# Patient Record
Sex: Female | Born: 1995
Health system: Southern US, Community
[De-identification: ages and names within clinical notes are randomized; demographics above are authoritative.]

## PROBLEM LIST (undated history)

## (undated) DIAGNOSIS — F329 Major depressive disorder, single episode, unspecified: Secondary | ICD-10-CM

## (undated) DIAGNOSIS — O4292 Full-term premature rupture of membranes, unspecified as to length of time between rupture and onset of labor: Secondary | ICD-10-CM

## (undated) DIAGNOSIS — F32A Depression, unspecified: Secondary | ICD-10-CM

## (undated) HISTORY — DX: Full-term premature rupture of membranes, unspecified as to length of time between rupture and onset of labor: O42.92

## (undated) HISTORY — DX: Depression, unspecified: F32.A

---
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
## 2015-07-21 DIAGNOSIS — F909 Attention-deficit hyperactivity disorder, unspecified type: Secondary | ICD-10-CM | POA: Insufficient documentation

## 2015-10-01 DIAGNOSIS — Z0000 Encounter for general adult medical examination without abnormal findings: Secondary | ICD-10-CM | POA: Insufficient documentation

## 2015-10-01 DIAGNOSIS — Z309 Encounter for contraceptive management, unspecified: Secondary | ICD-10-CM | POA: Insufficient documentation

## 2016-05-23 MED FILL — NORG-EE 0.18-0.215-0.25/0.0: 0.18/0.215/ | 84 days supply | Qty: 84 | Fill #0

## 2016-06-07 ENCOUNTER — Ambulatory Visit (INDEPENDENT_AMBULATORY_CARE_PROVIDER_SITE_OTHER): Payer: 59 | Admitting: Sports Medicine

## 2016-06-07 ENCOUNTER — Encounter: Payer: Self-pay | Admitting: Sports Medicine

## 2016-06-07 DIAGNOSIS — Q667 Congenital pes cavus, unspecified foot: Secondary | ICD-10-CM | POA: Insufficient documentation

## 2016-06-07 NOTE — Progress Notes (Signed)
   Subjective:    I'm seeing this patient as a consultation for:  Dr. Sunnie NielsenNatalie Duncan  CC: Right knee pain, pes cavus  HPI:  This is a pleasant 21 year old female, she has a history of what sounds to be hip dysplasia, over the years she has done well but unfortunately has had some pain that she localizes on the anterior right knee, worse with standing for long periods of time, moderate, persistent without radiation or mechanical symptoms.  Past medical history:  Negative.  See flowsheet/record as well for more information.  Surgical history: Negative.  See flowsheet/record as well for more information.  Family history: Negative.  See flowsheet/record as well for more information.  Social history: Negative.  See flowsheet/record as well for more information.  Allergies, and medications have been entered into the medical record, reviewed, and no changes needed.   Review of Systems: No headache, visual changes, nausea, vomiting, diarrhea, constipation, dizziness, abdominal pain, skin rash, fevers, chills, night sweats, weight loss, swollen lymph nodes, body aches, joint swelling, muscle aches, chest pain, shortness of breath, mood changes, visual or auditory hallucinations.   Objective:   General: Well Developed, well nourished, and in no acute distress.  Neuro/Psych: Alert and oriented x3, extra-ocular muscles intact, able to move all 4 extremities, sensation grossly intact. Skin: Warm and dry, no rashes noted.  Respiratory: Not using accessory muscles, speaking in full sentences, trachea midline.  Cardiovascular: Pulses palpable, no extremity edema. Abdomen: Does not appear distended. Right Knee: Normal to inspection with no erythema or effusion or obvious bony abnormalities. Palpation normal with no warmth or joint line tenderness or patellar tenderness or condyle tenderness. ROM normal in flexion and extension and lower leg rotation. Ligaments with solid consistent endpoints  including ACL, PCL, LCL, MCL. Negative Mcmurray's and provocative meniscal tests. Non painful patellar compression. Patellar and quadriceps tendons unremarkable. Hamstring and quadriceps strength is normal. Bilateral pes cavus   Patient was fitted for a : standard, cushioned, semi-rigid orthotic. The orthotic was heated and afterward the patient stood on the orthotic blank positioned on the orthotic stand. The patient was positioned in subtalar neutral position and 10 degrees of ankle dorsiflexion in a weight bearing stance. After completion of molding, a stable base was applied to the orthotic blank. The blank was ground to a stable position for weight bearing. Size: 7 Base: [facility] Additional Posting and Padding: None The patient ambulated these, and they were very comfortable.  Impression and Recommendations:   This case required medical decision making of moderate complexity.  Pes cavus Pes cavus with some right-sided knee pain consistent with dynamic overpronation. Custom orthotics as above, patellofemoral rehabilitation exercises given. Return to see me on an as-needed basis.

## 2016-06-07 NOTE — Assessment & Plan Note (Signed)
Pes cavus with some right-sided knee pain consistent with dynamic overpronation. Custom orthotics as above, patellofemoral rehabilitation exercises given. Return to see me on an as-needed basis.

## 2016-06-13 ENCOUNTER — Ambulatory Visit: Payer: 59 | Admitting: Osteopathic Medicine

## 2016-06-15 ENCOUNTER — Encounter: Payer: Self-pay | Admitting: Osteopathic Medicine

## 2016-06-15 ENCOUNTER — Ambulatory Visit (INDEPENDENT_AMBULATORY_CARE_PROVIDER_SITE_OTHER): Payer: 59 | Admitting: Osteopathic Medicine

## 2016-06-15 VITALS — BP 131/78 | HR 100 | Ht 65.0 in | Wt 139.0 lb

## 2016-06-15 DIAGNOSIS — F909 Attention-deficit hyperactivity disorder, unspecified type: Secondary | ICD-10-CM | POA: Diagnosis not present

## 2016-06-15 DIAGNOSIS — Z3041 Encounter for surveillance of contraceptive pills: Secondary | ICD-10-CM

## 2016-06-15 MED ORDER — METHYLPHENIDATE HCL ER 10 MG PO TBCR: 10.0000 mg | EXTENDED_RELEASE_TABLET | Freq: Every day | ORAL | 0 refills | Status: DC

## 2016-06-15 MED ORDER — NORGESTIM-ETH ESTRAD TRIPHASIC 0.18/0.215/0.25 MG-35 MCG PO TABS: 1.0000 | ORAL_TABLET | Freq: Every day | ORAL | 3 refills | Status: DC

## 2016-06-15 NOTE — Progress Notes (Signed)
HPI: Hannah Duncan is a 21 y.o. female  who presents to [facility] today, 06/15/16,  for chief complaint of:  Chief Complaint  Patient presents with  . Establish Care    MEDICATION REFILL    Attention deficit disorder: Doing well on Concerta, would like to continue this medication. [place] controlled substance database reviewed and records reviewed, consistent with patient's history.  Discussion of preventive care: Patient is 21 years old, due to start getting Pap smears. Patient requests refill on birth control, she states at some point she would like to try coming off of hormones, is considering copper IUD.  Past medical history, surgical history, social history and family history reviewed.  Patient Active Problem List   Diagnosis Date Noted  . Pes cavus 06/07/2016    Current medication list and allergy/intolerance information reviewed.   No current outpatient prescriptions on file prior to visit.   No current facility-administered medications on file prior to visit.    No Known Allergies    Review of Systems:  Constitutional: No recent illness  HEENT: No  headache, no vision change  Cardiac: No  chest pain, No  pressure, No palpitations  Respiratory:  No  shortness of breath. No  Cough  Gastrointestinal: No  abdominal pain, no change on bowel habits  Musculoskeletal: No new myalgia/arthralgia  Skin: No  Rash  Hem/Onc: No  easy bruising/bleeding, No  abnormal lumps/bumps  Neurologic: No  weakness, No  Dizziness  Psychiatric: No  concerns with depression, No  concerns with anxiety  Exam:  BP 131/78   Pulse 100   Ht  (1.651 m)   Wt 139 lb (63 kg)   BMI 23.13 kg/m   Constitutional: VS see above. General Appearance: alert, well-developed, well-nourished, NAD  Eyes: Normal lids and conjunctive, non-icteric sclera  Ears, Nose, Mouth, Throat: MMM, Normal external inspection ears/nares/mouth/lips/gums.  Neck: No  masses, trachea midline.   Respiratory: Normal respiratory effort. no wheeze, no rhonchi, no rales  Cardiovascular: S1/S2 normal, no murmur, no rub/gallop auscultated. RRR.   Musculoskeletal: Gait normal. Symmetric and independent movement of all extremities  Neurological: Normal balance/coordination. No tremor.  Skin: warm, dry, intact.   Psychiatric: Normal judgment/insight. Normal mood and affect. Oriented x3.      Depression screen PHQ 2/9 06/15/2016  Decreased Interest 0  Down, Depressed, Hopeless 0  PHQ - 2 Score 0      ASSESSMENT/PLAN:   3 prescriptions for ADHD medication were completed and provided to the patient. Controlled substance contract reviewed by medical assistant and on file.  Attention deficit hyperactivity disorder (ADHD), unspecified ADHD type - Controlled substance agreement signed 06/15/2016.  Encounter for surveillance of contraceptive pills    Follow-up plan: Return in about 3 months (around 09/14/2016) for ADHD MEDICATION REFILL, sooner if needed.  Visit summary with medication list and pertinent instructions was printed for patient to review, alert [place] if any changes needed. All questions at time of visit were answered - patient instructed to contact office with any additional concerns. ER/RTC precautions were reviewed with the patient and understanding verbalized.

## 2016-06-21 ENCOUNTER — Telehealth: Payer: Self-pay | Admitting: Emergency Medicine

## 2016-06-21 NOTE — Telephone Encounter (Signed)
Patient calling to say her pharmacy needs prior auth for her methylphenidate 10 MG ER tablet.

## 2016-06-21 NOTE — Telephone Encounter (Signed)
Help.

## 2016-06-23 ENCOUNTER — Ambulatory Visit: Payer: 59

## 2016-06-23 NOTE — Telephone Encounter (Signed)
Sent prior authorization through cover my meds waiting on determination from insurance company. - CF

## 2016-06-27 ENCOUNTER — Ambulatory Visit: Payer: 59

## 2016-07-01 NOTE — Telephone Encounter (Signed)
Methylphenidate has been approved through the insurance. Left message on patient's vm.pharmacy aware

## 2016-07-04 ENCOUNTER — Ambulatory Visit: Payer: 59 | Admitting: Sports Medicine

## 2016-07-14 ENCOUNTER — Ambulatory Visit: Payer: 59 | Admitting: Sports Medicine

## 2016-07-26 MED FILL — METHYLPHENIDATE ER 10 MG TA: 10 | 30 days supply | Qty: 30 | Fill #0

## 2016-08-23 MED FILL — METHYLPHENIDATE ER 10 MG TA: 10 | 30 days supply | Qty: 30 | Fill #0

## 2016-09-15 ENCOUNTER — Encounter: Payer: Self-pay | Admitting: Osteopathic Medicine

## 2016-09-15 ENCOUNTER — Ambulatory Visit (INDEPENDENT_AMBULATORY_CARE_PROVIDER_SITE_OTHER): Payer: 59 | Admitting: Osteopathic Medicine

## 2016-09-15 VITALS — BP 112/55 | HR 89 | Ht 65.0 in | Wt 138.1 lb

## 2016-09-15 DIAGNOSIS — F909 Attention-deficit hyperactivity disorder, unspecified type: Secondary | ICD-10-CM

## 2016-09-15 MED ORDER — METHYLPHENIDATE HCL 10 MG PO TABS: 10.0000 mg | ORAL_TABLET | Freq: Two times a day (BID) | ORAL | 0 refills | Status: DC

## 2016-09-15 MED ORDER — METHYLPHENIDATE HCL 10 MG PO TABS
10.0000 mg | ORAL_TABLET | Freq: Two times a day (BID) | ORAL | 0 refills | Status: DC
Start: 2016-09-15 — End: 2016-12-26

## 2016-09-15 NOTE — Progress Notes (Signed)
HPI: Hannah Duncan is a 21 y.o. female  who [facility] today, 09/15/16,  for chief complaint of:  Chief Complaint  Patient presents with  . ADHD    Attention deficit disorder: Doing well on Concerta, would like to continue this medication however the extended release formulation is quite expensive so would like to switch to the IR version.     Past medical history, surgical history, social history and family history reviewed.  Patient Active Problem List   Diagnosis Date Noted  . Pes cavus 06/07/2016  . Encounter for contraceptive management 10/01/2015  . Attention deficit hyperactivity disorder (ADHD) 07/21/2015    Current medication list and allergy/intolerance information reviewed.   Current Outpatient Prescriptions on File Prior to Visit  Medication Sig Dispense Refill  . Ascorbic Acid (VITAMIN C) 100 MG tablet Take 100 mg by mouth daily.    . cholecalciferol (VITAMIN D) 1000 units tablet Take 1,000 Units by mouth daily.    . Cranberry 125 MG TABS Take by mouth.    . methylphenidate 10 MG ER tablet Take 1 tablet (10 mg total) by mouth daily. 30 tablet 0  . Norgestimate-Ethinyl Estradiol Triphasic 0.18/0.215/0.25 MG-35 MCG tablet Take 1 tablet by mouth daily. (Patient not taking: Reported on 09/15/2016) 3 Package 3   No current facility-administered medications on file prior to visit.    No Known Allergies    Review of Systems:  Constitutional: No recent illness, feeling well today   Cardiac: No  chest pain, No  pressure, No palpitations  Respiratory:  No  shortness of breath.  Exam:  BP (!) 112/55   Pulse 89   Ht 5\' 5"  (1.651 m)   Wt 138 lb 1.6 oz (62.6 kg)   LMP 09/09/2016   SpO2 100%   BMI 22.98 kg/m   Constitutional: VS see above. General Appearance: alert, well-developed, well-nourished, NAD  Neck: No masses, trachea midline.   Respiratory: Normal respiratory effort.   Musculoskeletal: Gait normal.    Neurological: Normal balance/coordination. No tremor.  Psychiatric: Normal judgment/insight. Normal mood and affect. Oriented x3.      Depression screen PHQ 2/9 06/15/2016  Decreased Interest 0  Down, Depressed, Hopeless 0  PHQ - 2 Score 0      ASSESSMENT/PLAN:   3 prescriptions for ADHD medication were completed and provided to the patient. Controlled substance contract on file.  Note: stopped OCP d/t acne, no need for contraception at this time, pt will call me if this is a need in the future   Attention deficit hyperactivity disorder (ADHD), unspecified ADHD type - Plan: methylphenidate (RITALIN) 10 MG tablet, methylphenidate (RITALIN) 10 MG tablet, methylphenidate (RITALIN) 10 MG tablet    Follow-up plan: Return in about 3 months (around 12/16/2016) for recheck ADHD, sooner if needed.  Visit summary with medication list and pertinent instructions was printed for patient to review, alert us if any changes needed. All questions at time of visit were answered - patient instructed to contact office with any additional concerns. ER/RTC precautions were reviewed with the patient and understanding verbalized.

## 2016-09-21 ENCOUNTER — Encounter: Payer: Self-pay | Admitting: Osteopathic Medicine

## 2016-09-21 ENCOUNTER — Ambulatory Visit (INDEPENDENT_AMBULATORY_CARE_PROVIDER_SITE_OTHER): Payer: 59 | Admitting: Osteopathic Medicine

## 2016-09-21 ENCOUNTER — Other Ambulatory Visit (HOSPITAL_COMMUNITY)
Admission: RE | Admit: 2016-09-21 | Discharge: 2016-09-21 | Disposition: A | Discharge status: Home / Self Care | Payer: 59 | Source: Ambulatory Visit | Attending: Family Medicine | Admitting: Family Medicine

## 2016-09-21 VITALS — BP 128/63 | HR 94

## 2016-09-21 DIAGNOSIS — Z0000 Encounter for general adult medical examination without abnormal findings: Secondary | ICD-10-CM | POA: Diagnosis not present

## 2016-09-21 DIAGNOSIS — Z01419 Encounter for gynecological examination (general) (routine) without abnormal findings: Secondary | ICD-10-CM | POA: Diagnosis not present

## 2016-09-21 NOTE — Patient Instructions (Signed)
Plan:  Pap results will be back within a week, please call us if you don't hear back about results!  See below for viral cold treatments.  As long as everything is doing well, we will see you as usual for your ADHD followup, sooner if needed.    Over-the-Counter Medications & Home Remedies for Upper Respiratory Illness  Note: the following list assumes no pregnancy, normal liver & kidney function and no other drug interactions. Dr. Lyn HollingsheadAlexander has highlighted medications which are safe for you to use, but these may not be appropriate for everyone. Always ask a pharmacist or qualified medical provider if you have any questions!   Aches/Pains, Fever, Headache Acetaminophen (Tylenol) 500 mg tablets - take max 2 tablets (1000 mg) every 6 hours (4 times per day)  Ibuprofen (Motrin) 200 mg tablets - take max 4 tablets (800 mg) every 6 hours*  Sinus Congestion Nasal Saline if desired Oxymetolazone (Afrin, others) sparing use due to rebound congestion, NEVER use in kids Phenylephrine (Sudafed) 10 mg tablets every 4 hours (or the 12-hour formulation)* Diphenhydramine (Benadryl) 25 mg tablets - take max 2 tablets every 4 hours  Cough & Sore Throat Prescription cough pills or syrups as directed Dextromethorphan (Robitussin, others) - cough suppressant Guaifenesin (Robitussin, Mucinex, others) - expectorant (helps cough up mucus) (Dextromethorphan and Guaifenesin also come in a combination tablet) Lozenges w/ Benzocaine + Menthol (Cepacol) Honey - as much as you want! Teas which "coat the throat" - look for ingredients Elm Bark, Licorice Root, Marshmallow Root  Other Zinc Lozenges within 24 hours of symptoms onset - mixed evidence this shortens the duration of the common cold Don't waste your money on Vitamin C or Echinacea  *Caution in patients with high blood pressure

## 2016-09-21 NOTE — Progress Notes (Signed)
HPI: Hannah Duncan is a 21 y.o. female  who presents to [facility] today, 09/21/16,  for chief complaint of:  Chief Complaint  Patient presents with  . Annual Exam      Patient here for annual physical / wellness exam.  See preventive care reviewed as below.  Recent labs reviewed in detail with the patient.   Additional concerns today include:  Feeling sick a few days, thinks just a cold, not really bothersome   Past medical, surgical, social and family history reviewed: Patient Active Problem List   Diagnosis Date Noted  . Pes cavus 06/07/2016  . Encounter for contraceptive management 10/01/2015  . Attention deficit hyperactivity disorder (ADHD) 07/21/2015   Past Surgical History:  Procedure Laterality Date  . WISDOM TOOTH EXTRACTION  2012   Social History  Substance Use Topics  . Smoking status: Never Smoker  . Smokeless tobacco: Never Used  . Alcohol use Not on file   Family History  Problem Relation Age of Onset  . Cancer Mother   . Hyperlipidemia Father   . Cancer Maternal Grandmother   . Cancer Maternal Grandfather      Current medication list and allergy/intolerance information reviewed:   Current Outpatient Prescriptions  Medication Sig Dispense Refill  . Ascorbic Acid (VITAMIN C) 100 MG tablet Take 100 mg by mouth daily.    . cholecalciferol (VITAMIN D) 1000 units tablet Take 1,000 Units by mouth daily.    . Cranberry 125 MG TABS Take by mouth.    . methylphenidate (RITALIN) 10 MG tablet Take 1 tablet (10 mg total) by mouth 2 (two) times daily with breakfast and lunch. 60 tablet 0  . methylphenidate (RITALIN) 10 MG tablet Take 1 tablet (10 mg total) by mouth 2 (two) times daily with breakfast and lunch. Fill no sooner than 30 days from date on Rx 60 tablet 0  . methylphenidate (RITALIN) 10 MG tablet Take 1 tablet (10 mg total) by mouth 2 (two) times daily with breakfast and lunch. Fill no sooner than 60 days from date on  Rx 60 tablet 0   No current facility-administered medications for this visit.    No Known Allergies    Review of Systems:  Constitutional:  No  fever, no chills, No unintentional weight changes. No significant fatigue.   HEENT: No  headache, no vision change, no hearing change, +sore throat, +sinus pressure  Cardiac: No  chest pain, No  pressure  Respiratory:  No  shortness of breath. No  Cough  Gastrointestinal: No  abdominal pain, No  nausea, No  vomiting,  Musculoskeletal: No new myalgia/arthralgia  Genitourinary: No  incontinence, No  abnormal genital bleeding, No abnormal genital discharge  Skin: No  Rash  Neurologic: No  weakness, No  dizziness,  Psychiatric: No  concerns with depression, No  concerns with anxiety  Exam:  BP 128/63   Pulse 94   LMP 09/09/2016   Constitutional: VS see above. General Appearance: alert, well-developed, well-nourished, NAD  Eyes: Normal lids and conjunctive, non-icteric sclera  Ears, Nose, Mouth, Throat: MMM, Normal external inspection ears/nares/mouth/lips/gums. TM normal bilaterally. Pharynx/tonsils no erythema, no exudate. Nasal mucosa normal.   Neck: No masses, trachea midline. No thyroid enlargement. No tenderness/mass appreciated. No lymphadenopathy  Respiratory: Normal respiratory effort. no wheeze, no rhonchi, no rales  Cardiovascular: S1/S2 normal, no murmur, no rub/gallop auscultated. RRR. No lower extremity edema.   Gastrointestinal: Nontender, no masses.   Musculoskeletal: Gait normal. No clubbing/cyanosis of digits.  Neurological: Normal balance/coordination. No tremor.   Skin: warm, dry, intact. No rash/ulcer. No concerning nevi or subq nodules on limited exam.    Psychiatric: Normal judgment/insight. Normal mood and affect. Oriented x3.  GYN: No lesions/ulcers to external genitalia, normal urethra, normal vaginal mucosa, physiologic discharge, cervix normal without lesions, uterus not enlarged or tender, adnexa  no masses and nontender  BREAST: No rashes/skin changes, normal fibrous breast tissue, no masses or tenderness, normal nipple without discharge, normal axilla    ASSESSMENT/PLAN:   Annual physical exam - Plan: COMPLETE METABOLIC PANEL WITH GFR, Lipid panel, Cytology - PAP   FEMALE PREVENTIVE CARE Updated 09/21/16   ANNUAL SCREENING/COUNSELING  Diet/Exercise - HEALTHY HABITS DISCUSSED TO DECREASE CV RISK History  Smoking Status  . Never Smoker  Smokeless Tobacco  . Never Used   History  Alcohol use Not on file  occasional   [facility] 06/15/2016  Decreased Interest 0  Down, Depressed, Hopeless 0  PHQ - 2 Score 0    Domestic violence concerns - no  HTN SCREENING - SEE VITALS  SEXUAL HEALTH  Sexually active in the past year - Yes with female. One partner   Need/want STI testing today? - no - declines  Concerns about libido or pain with sex? - no  Plans for pregnancy? - none at the moment   INFECTIOUS DISEASE SCREENING  HIV - declined  GC/CT - declined  HepC - DOB [birth year] - does not need  TB - does not need  DISEASE SCREENING  Lipid - does not need  DM2 - does not need  Osteoporosis - women age 99+ - does not need  CANCER SCREENING  Cervical - needs  Breast - does not need  Lung - does not need  Colon - does not need  ADULT VACCINATION  Influenza - annual vaccine recommended  Td - booster every 10 years   Zoster - option at 3550, yes at 60+   PCV13 - was not indicated  PPSV23 - was not indicated Immunization History  Administered Date(s) Administered  . HPV Quadrivalent 03/24/2008, 06/03/2009, 09/21/2009  . Influenza, Seasonal, Injecte, Preservative Fre 11/27/2015  . PPD Test 07/10/2015, 07/21/2015  . Tdap 03/15/2007    Patient Instructions  Plan:  Pap results will be back within a week, please call us if you don't hear back about results!  See below for viral cold treatments.  As long as everything is doing well,  we will see you as usual for your ADHD followup, sooner if needed.    Over-the-Counter Medications & Home Remedies for Upper Respiratory Illness  Note: the following list assumes no pregnancy, normal liver & kidney function and no other drug interactions. Dr. Lyn HollingsheadAlexander has highlighted medications which are safe for you to use, but these may not be appropriate for everyone. Always ask a pharmacist or qualified medical provider if you have any questions!   Aches/Pains, Fever, Headache Acetaminophen (Tylenol) 500 mg tablets - take max 2 tablets (1000 mg) every 6 hours (4 times per day)  Ibuprofen (Motrin) 200 mg tablets - take max 4 tablets (800 mg) every 6 hours*  Sinus Congestion Nasal Saline if desired Oxymetolazone (Afrin, others) sparing use due to rebound congestion, NEVER use in kids Phenylephrine (Sudafed) 10 mg tablets every 4 hours (or the 12-hour formulation)* Diphenhydramine (Benadryl) 25 mg tablets - take max 2 tablets every 4 hours  Cough & Sore Throat Prescription cough pills or syrups as directed Dextromethorphan (Robitussin, others) - cough suppressant  Guaifenesin (Robitussin, Mucinex, others) - expectorant (helps cough up mucus) (Dextromethorphan and Guaifenesin also come in a combination tablet) Lozenges w/ Benzocaine + Menthol (Cepacol) Honey - as much as you want! Teas which "coat the throat" - look for ingredients Elm Bark, Licorice Root, Marshmallow Root  Other Zinc Lozenges within 24 hours of symptoms onset - mixed evidence this shortens the duration of the common cold Don't waste your money on Vitamin C or Echinacea  *Caution in patients with high blood pressure       Visit summary with medication list and pertinent instructions was printed for patient to review. All questions at time of visit were answered - patient instructed to contact office with any additional concerns. ER/RTC precautions were reviewed with the patient. Follow-up plan: Return in about 1  year (around 09/21/2017) for [facility] PHYSICAL, sooner if needed and as directed for ADHD followup.

## 2016-09-22 LAB — LIPID PANEL
CHOL/HDL RATIO: 2.6 ratio (ref <5.0)
CHOLESTEROL: 194 mg/dL (ref <200)
HDL: 75 mg/dL (ref >50)
LDL CALC: 106 mg/dL — AB (ref <100)
TRIGLYCERIDES: 63 mg/dL (ref <150)
VLDL: 13 mg/dL (ref <30)

## 2016-09-22 LAB — COMPLETE METABOLIC PANEL WITH GFR
ALT: 27 U/L (ref 6–29)
AST: 29 U/L (ref 10–30)
Albumin: 4.6 g/dL (ref 3.6–5.1)
Alkaline Phosphatase: 77 U/L (ref 33–115)
BUN: 9 mg/dL (ref 7–25)
CHLORIDE: 100 mmol/L (ref 98–110)
CO2: 24 mmol/L (ref 20–31)
Calcium: 9.8 mg/dL (ref 8.6–10.2)
Creat: 0.6 mg/dL (ref 0.50–1.10)
GFR, Est African American: >89 mL/min (ref >=60)
GFR, Est Non African American: >89 mL/min (ref >=60)
Glucose, Bld: 82 mg/dL (ref 65–99)
POTASSIUM: 3.9 mmol/L (ref 3.5–5.3)
Sodium: 137 mmol/L (ref 135–146)
Total Bilirubin: 0.6 mg/dL (ref 0.2–1.2)
Total Protein: 7.3 g/dL (ref 6.1–8.1)

## 2016-09-26 LAB — CYTOLOGY - PAP: Diagnosis: NEGATIVE

## 2016-10-04 MED FILL — METHYLPHENIDATE 10 MG TAB: 10 | 30 days supply | Qty: 60 | Fill #0

## 2016-10-07 ENCOUNTER — Ambulatory Visit (INDEPENDENT_AMBULATORY_CARE_PROVIDER_SITE_OTHER): Payer: 59

## 2016-10-07 ENCOUNTER — Ambulatory Visit: Payer: 59 | Admitting: Osteopathic Medicine

## 2016-10-07 ENCOUNTER — Encounter (INDEPENDENT_AMBULATORY_CARE_PROVIDER_SITE_OTHER): Payer: Self-pay | Admitting: Orthopaedic Surgery

## 2016-10-07 ENCOUNTER — Ambulatory Visit (INDEPENDENT_AMBULATORY_CARE_PROVIDER_SITE_OTHER): Payer: 59 | Admitting: Orthopaedic Surgery

## 2016-10-07 DIAGNOSIS — M25562 Pain in left knee: Secondary | ICD-10-CM
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
## 2016-11-03 MED FILL — METHYLPHENIDATE 10 MG TAB: 10 | 30 days supply | Qty: 60 | Fill #0

## 2016-11-10 DIAGNOSIS — F321 Major depressive disorder, single episode, moderate: Secondary | ICD-10-CM | POA: Diagnosis not present

## 2016-11-10 DIAGNOSIS — F909 Attention-deficit hyperactivity disorder, unspecified type: Secondary | ICD-10-CM | POA: Diagnosis not present

## 2016-11-21 DIAGNOSIS — F909 Attention-deficit hyperactivity disorder, unspecified type: Secondary | ICD-10-CM | POA: Diagnosis not present

## 2016-11-21 DIAGNOSIS — F321 Major depressive disorder, single episode, moderate: Secondary | ICD-10-CM | POA: Diagnosis not present

## 2016-12-06 MED FILL — METHYLPHENIDATE 10 MG TAB: 10 | 30 days supply | Qty: 60 | Fill #0

## 2016-12-07 DIAGNOSIS — F909 Attention-deficit hyperactivity disorder, unspecified type: Secondary | ICD-10-CM | POA: Diagnosis not present

## 2016-12-07 DIAGNOSIS — F321 Major depressive disorder, single episode, moderate: Secondary | ICD-10-CM | POA: Diagnosis not present

## 2016-12-12 ENCOUNTER — Ambulatory Visit: Payer: 59

## 2016-12-14 DIAGNOSIS — F321 Major depressive disorder, single episode, moderate: Secondary | ICD-10-CM | POA: Diagnosis not present

## 2016-12-14 DIAGNOSIS — F909 Attention-deficit hyperactivity disorder, unspecified type: Secondary | ICD-10-CM | POA: Diagnosis not present

## 2016-12-19 ENCOUNTER — Ambulatory Visit: Payer: 59 | Admitting: Osteopathic Medicine

## 2016-12-26 ENCOUNTER — Encounter: Payer: Self-pay | Admitting: Osteopathic Medicine

## 2016-12-26 ENCOUNTER — Ambulatory Visit (INDEPENDENT_AMBULATORY_CARE_PROVIDER_SITE_OTHER): Payer: 59 | Admitting: Osteopathic Medicine

## 2016-12-26 VITALS — BP 119/70 | HR 78 | Ht 65.0 in | Wt 143.0 lb

## 2016-12-26 DIAGNOSIS — F331 Major depressive disorder, recurrent, moderate: Secondary | ICD-10-CM | POA: Diagnosis not present

## 2016-12-26 DIAGNOSIS — F321 Major depressive disorder, single episode, moderate: Secondary | ICD-10-CM | POA: Diagnosis not present

## 2016-12-26 DIAGNOSIS — F909 Attention-deficit hyperactivity disorder, unspecified type: Secondary | ICD-10-CM | POA: Diagnosis not present

## 2016-12-26 MED ORDER — METHYLPHENIDATE HCL 20 MG PO TABS: 20.0000 mg | ORAL_TABLET | Freq: Two times a day (BID) | ORAL | 0 refills | Status: DC

## 2016-12-26 MED ORDER — SCOPOLAMINE 1 MG/3DAYS TD PT72: 1.0000 | MEDICATED_PATCH | TRANSDERMAL | 1 refills | Status: DC

## 2016-12-26 MED ORDER — BUPROPION HCL ER (XL) 150 MG PO TB24: 150.0000 mg | ORAL_TABLET | ORAL | 1 refills | Status: DC

## 2016-12-26 MED FILL — METHYLPHENIDATE 20 MG TAB: 20 | 30 days supply | Qty: 60 | Fill #0

## 2016-12-26 MED FILL — BUPROPION HCL XL 150 MG TAB: 150 | 30 days supply | Qty: 30 | Fill #0

## 2016-12-26 NOTE — Progress Notes (Signed)
HPI: Hannah Duncan is a 21 y.o. female  who presents to [facility] today, 12/26/16,  for chief complaint of:  Chief Complaint  Patient presents with  . Depression    ADHD: we switched to Ritalin bid last visit d/t cost of Concerta.   Depression: Following with counselor. There is concern for major depressive disorder. Patient states she has been dealing with depression on and off for several years, never been on medications but family members have done well with Prozac and with Wellbutrin. Patient would like to try Wellbutrin since a few of her aunts have done really well on this medicine.   Past medical history, surgical history, social history and family history reviewed.  Patient Active Problem List   Diagnosis Date Noted  . Pes cavus 06/07/2016  . Encounter for contraceptive management 10/01/2015  . Attention deficit hyperactivity disorder (ADHD) 07/21/2015    Current medication list and allergy/intolerance information reviewed.   Current Outpatient Prescriptions on File Prior to Visit  Medication Sig Dispense Refill  . Ascorbic Acid (VITAMIN C) 100 MG tablet Take 100 mg by mouth daily.    . cholecalciferol (VITAMIN D) 1000 units tablet Take 1,000 Units by mouth daily.    . Cranberry 125 MG TABS Take by mouth.    . methylphenidate (RITALIN) 10 MG tablet Take 1 tablet (10 mg total) by mouth 2 (two) times daily with breakfast and lunch. 60 tablet 0  . methylphenidate (RITALIN) 10 MG tablet Take 1 tablet (10 mg total) by mouth 2 (two) times daily with breakfast and lunch. Fill no sooner than 30 days from date on Rx 60 tablet 0   No current facility-administered medications on file prior to visit.    No Known Allergies    Review of Systems:  Constitutional: No recent illness  Cardiac: No  chest pain, No  pressure,   Respiratory:  No  shortness of breath.   Neurologic: No  weakness, No  Dizziness  Psychiatric: +concerns with depression,  No  concerns with anxiety  Exam:  BP 119/70   Pulse 78   Ht  (1.651 m)   Wt 143 lb (64.9 kg)   BMI 23.80 kg/m   Constitutional: VS see above. General Appearance: alert, well-developed, well-nourished, NAD  Eyes: Normal lids and conjunctive, non-icteric sclera  Ears, Nose, Mouth, Throat: MMM, Normal external inspection ears/nares/mouth/lips/gums.  Neck: No masses, trachea midline.   Respiratory: Normal respiratory effort. no wheeze, no rhonchi, no rales  Cardiovascular: S1/S2 normal, no murmur, no rub/gallop auscultated. RRR.   Musculoskeletal: Gait normal. Symmetric and independent movement of all extremities  Neurological: Normal balance/coordination. No tremor.  Skin: warm, dry, intact.   Psychiatric: Normal judgment/insight. Normal mood and affect. Oriented x3.      ASSESSMENT/PLAN:   Moderate episode of recurrent major depressive disorder (HCC) - Plan: buPROPion (WELLBUTRIN XL) 150 MG 24 hr tablet  Attention deficit hyperactivity disorder (ADHD), unspecified ADHD type - Plan: methylphenidate (RITALIN) 20 MG tablet, DISCONTINUED: methylphenidate (RITALIN) 20 MG tablet, DISCONTINUED: methylphenidate (RITALIN) 20 MG tablet    Patient Instructions  Plan:  Start Wellbutrin 150 mg   If doing well, can refill this  If struggling, can increase dose to 300 mg after 4-6 weeks  - let me know!     Follow-up plan: Return in about 3 months (around 03/28/2017) for refill ADHD medications, sooner if needed .  Visit summary with medication list and pertinent instructions was printed for patient to review, alert [place]  if any changes needed. All questions at time of visit were answered - patient instructed to contact office with any additional concerns. ER/RTC precautions were reviewed with the patient and understanding verbalized.

## 2016-12-26 NOTE — Patient Instructions (Signed)
Plan:  Start Wellbutrin 150 mg   If doing well, can refill this  If struggling, can increase dose to 300 mg after 4-6 weeks  - let me know!

## 2016-12-27 DIAGNOSIS — F331 Major depressive disorder, recurrent, moderate: Secondary | ICD-10-CM | POA: Insufficient documentation

## 2017-01-09 DIAGNOSIS — F321 Major depressive disorder, single episode, moderate: Secondary | ICD-10-CM | POA: Diagnosis not present

## 2017-01-09 DIAGNOSIS — F909 Attention-deficit hyperactivity disorder, unspecified type: Secondary | ICD-10-CM | POA: Diagnosis not present

## 2017-01-23 MED FILL — BUPROPION HCL XL 150 MG TAB: 150 | 30 days supply | Qty: 30 | Fill #1

## 2017-01-25 MED FILL — TRANSDERM-SCOP 1.5 MG/3 DAY: 1 | 12 days supply | Qty: 4 | Fill #0

## 2017-01-25 MED FILL — METHYLPHENIDATE 20 MG TAB: 20 | 30 days supply | Qty: 60 | Fill #0

## 2017-02-01 ENCOUNTER — Encounter: Payer: Self-pay | Admitting: Osteopathic Medicine

## 2017-02-01 ENCOUNTER — Other Ambulatory Visit: Payer: Self-pay | Admitting: Osteopathic Medicine

## 2017-02-01 DIAGNOSIS — F331 Major depressive disorder, recurrent, moderate: Secondary | ICD-10-CM

## 2017-02-01 MED ORDER — BUPROPION HCL ER (XL) 150 MG PO TB24: 150.0000 mg | ORAL_TABLET | ORAL | 1 refills | Status: DC

## 2017-02-01 NOTE — Progress Notes (Signed)
refill 

## 2017-02-23 MED FILL — buPROPion HCL ER (XL) 150 M: 150 | 90 days supply | Qty: 90 | Fill #0

## 2017-03-08 DIAGNOSIS — H52223 Regular astigmatism, bilateral: Secondary | ICD-10-CM | POA: Diagnosis not present

## 2017-03-08 DIAGNOSIS — H5213 Myopia, bilateral: Secondary | ICD-10-CM | POA: Diagnosis not present

## 2017-03-28 ENCOUNTER — Ambulatory Visit: Payer: 59 | Admitting: Osteopathic Medicine

## 2017-03-29 ENCOUNTER — Ambulatory Visit: Payer: Self-pay | Admitting: Osteopathic Medicine

## 2017-03-29 DIAGNOSIS — Z0189 Encounter for other specified special examinations: Secondary | ICD-10-CM

## 2017-04-04 ENCOUNTER — Ambulatory Visit: Payer: Self-pay | Admitting: Osteopathic Medicine

## 2017-04-06 ENCOUNTER — Encounter: Payer: Self-pay | Admitting: Osteopathic Medicine

## 2017-04-06 ENCOUNTER — Ambulatory Visit (INDEPENDENT_AMBULATORY_CARE_PROVIDER_SITE_OTHER): Payer: No Typology Code available for payment source | Admitting: Osteopathic Medicine

## 2017-04-06 VITALS — BP 109/70 | HR 88 | Wt 140.0 lb

## 2017-04-06 DIAGNOSIS — F909 Attention-deficit hyperactivity disorder, unspecified type: Secondary | ICD-10-CM | POA: Diagnosis not present

## 2017-04-06 MED ORDER — METHYLPHENIDATE HCL 20 MG PO TABS: 20.0000 mg | ORAL_TABLET | Freq: Two times a day (BID) | ORAL | 0 refills | Status: DC

## 2017-04-06 NOTE — Progress Notes (Signed)
HPI: Hannah Duncan is a 22 y.o. female  who presents to [facility] [place] today, 04/06/17,  for chief complaint of:  ADHD follow-up and refills    ADHD: we switched to Ritalin bid at a previous visit d/t cost of Concerta. At last visit and today, doing well on this medication.   Depression: Doing well on Wellbutrin.   Past medical history, surgical history, social history and family history reviewed.  Patient Active Problem List   Diagnosis Date Noted  . Moderate episode of recurrent major depressive disorder (HCC) 12/27/2016  . Pes cavus 06/07/2016  . Encounter for contraceptive management 10/01/2015  . Attention deficit hyperactivity disorder (ADHD) 07/21/2015    Current medication list and allergy/intolerance information reviewed.   Current Outpatient Medications on File Prior to Visit  Medication Sig Dispense Refill  . Ascorbic Acid (VITAMIN C) 100 MG tablet Take 100 mg by mouth daily.    Marland Kitchen. buPROPion (WELLBUTRIN XL) 150 MG 24 hr tablet Take 1 tablet (150 mg total) by mouth every morning. 90 tablet 1  . cholecalciferol (VITAMIN D) 1000 units tablet Take 1,000 Units by mouth daily.    . Cranberry 125 MG TABS Take by mouth.    . methylphenidate (RITALIN) 20 MG tablet Take 1 tablet (20 mg total) by mouth 2 (two) times daily with breakfast and lunch. 60 tablet 0  . scopolamine (TRANSDERM-SCOP, 1.5 MG,) 1 MG/3DAYS Place 1 patch (1.5 mg total) onto the skin every 3 (three) days. 4 patch 1   No current facility-administered medications on file prior to visit.    No Known Allergies    Review of Systems:  Constitutional: No recent illness  Cardiac: No  chest pain, No  pressure  Psychiatric: No concerns with depression, No  concerns with anxiety  Exam:  BP 109/70   Pulse 88   Wt 140 lb (63.5 kg)   BMI 23.30 kg/m   Constitutional: VS see above. General Appearance: alert, well-developed, well-nourished, NAD  Eyes: Normal lids and conjunctive,  non-icteric sclera  Ears, Nose, Mouth, Throat: MMM, Normal external inspection ears/nares/mouth/lips/gums.  Neck: No masses, trachea midline.   Respiratory: Normal respiratory effort. no wheeze, no rhonchi, no rales  Cardiovascular: S1/S2 normal, no murmur, no rub/gallop auscultated. RRR.   Musculoskeletal: Gait normal. Symmetric and independent movement of all extremities  Neurological: Normal balance/coordination. No tremor.  Skin: warm, dry, intact.   Psychiatric: Normal judgment/insight. Normal mood and affect. Oriented x3.      ASSESSMENT/PLAN:   Attention deficit hyperactivity disorder (ADHD), unspecified ADHD type - prefers printed Rx - 3 Rx given to patient today  - Plan: methylphenidate (RITALIN) 20 MG tablet, DISCONTINUED: methylphenidate (RITALIN) 20 MG tablet, DISCONTINUED: methylphenidate (RITALIN) 20 MG tablet     Follow-up plan: Return in about 3 months (around 07/05/2017) for ADHD med refill, sooner if needed .  Visit summary with medication list and pertinent instructions was printed for patient to review, alert us if any changes needed. All questions at time of visit were answered - patient instructed to contact office with any additional concerns. ER/RTC precautions were reviewed with the patient and understanding verbalized.

## 2017-04-11 ENCOUNTER — Encounter: Payer: Self-pay | Admitting: Osteopathic Medicine

## 2017-04-11 DIAGNOSIS — Z113 Encounter for screening for infections with a predominantly sexual mode of transmission: Secondary | ICD-10-CM

## 2017-04-17 ENCOUNTER — Encounter: Payer: Self-pay | Admitting: Osteopathic Medicine

## 2017-04-17 ENCOUNTER — Telehealth: Payer: Self-pay | Admitting: Osteopathic Medicine

## 2017-04-17 DIAGNOSIS — Z113 Encounter for screening for infections with a predominantly sexual mode of transmission: Secondary | ICD-10-CM

## 2017-04-17 NOTE — Telephone Encounter (Signed)
Routing to Provider to see which IUD she is going to use.

## 2017-04-17 NOTE — Telephone Encounter (Signed)
Patient called this morning to schedule an IUD placement. She mentioned that labs were already ordered.

## 2017-04-19 NOTE — Telephone Encounter (Signed)
Paragard ordered.

## 2017-04-19 NOTE — Telephone Encounter (Signed)
She wants Paragard Orders are in to screen GC/CT can go to lab, can let her know that results take a few days to come back so recommend a week prior to her visit for the insertion.   See MyChart message - pt aware to premedicate w/ 800 mg ibuprofen and schedule the insertion prefrable while on her period

## 2017-04-24 ENCOUNTER — Other Ambulatory Visit: Payer: Self-pay | Admitting: Osteopathic Medicine

## 2017-04-24 LAB — GC/CHLAMYDIA PROBE AMP
CHLAMYDIA BY NAA: NEGATIVE
Neisseria gonorrhoeae, NAA: NEGATIVE

## 2017-04-26 ENCOUNTER — Telehealth: Payer: Self-pay | Admitting: Osteopathic Medicine

## 2017-04-26 LAB — GC/CHLAMYDIA PROBE AMP
CHLAMYDIA, DNA PROBE: NEGATIVE
Neisseria gonorrhoeae by PCR: NEGATIVE

## 2017-04-26 NOTE — Telephone Encounter (Signed)
Please call patient: I don't see gonorrhea/chlamydia results on file for her. See MyChart correspondence w. Riley ChurchesKelsi from 04/17/17 - patient requested the order be sent to [facility], we do not have a fax number that she may have come up front to pick up the order. Can we confirm whether or not she gave a specimen there? If so, can we call [facility] to get the results?   If she did not give a specimen yet, results typically take a 1-3 days to come back. We should probably reschedule her IUD insertion if we cannot confirm a negative test prior to the procedure.

## 2017-04-26 NOTE — Telephone Encounter (Signed)
Labs negative - proceed w/ IUD, pt aware thanks

## 2017-04-26 NOTE — Telephone Encounter (Signed)
As per pt, she completed lab order on 04/24/17 at [facility] in [place]. Pt will contact [facility] to have results forwarded to provider. Pt is aware that IUD insertion will be delayed if results are not available. Pt was provided call back information once results are obtained.

## 2017-04-27 ENCOUNTER — Ambulatory Visit (INDEPENDENT_AMBULATORY_CARE_PROVIDER_SITE_OTHER): Payer: No Typology Code available for payment source | Admitting: Osteopathic Medicine

## 2017-04-27 ENCOUNTER — Encounter: Payer: Self-pay | Admitting: Osteopathic Medicine

## 2017-04-27 VITALS — BP 123/74 | HR 104 | Temp 98.6°F | Resp 18 | Wt 141.2 lb

## 2017-04-27 DIAGNOSIS — Z30430 Encounter for insertion of intrauterine contraceptive device: Secondary | ICD-10-CM | POA: Diagnosis not present

## 2017-04-27 LAB — POCT URINE PREGNANCY: Preg Test, Ur: NEGATIVE

## 2017-04-27 NOTE — Patient Instructions (Signed)
IUD AFTER-CARE INSTRUCTIONS: READ THOROUGHLY  Your Paragard IUD is currently approved to remain in place for 10 years. At that time, if you wish to receive a new IUD, this can be placed when your current one is removed. If you wish to remove your IUD at any time, for any reason, this can be done easily by your doctor.   You should feel for the strings to your IUD routinely. If you cannot locate the strings, it is recommended you alert your doctor of this.   Be aware that in the first few weeks, your new IUD may cause some discomfort/cramping as it settles into place in your uterus. To ease discomfort, you may apply heating pad to abdomen and take Ibuprofen 800mg  by mouth every 6 hours as needed, but avoid using this dose continuously for more than 5 days. Walking helps as well. You can also expect some irregular bleeding but it should not be heavy for more than a few days, though some bleeding can last up to 4-6 weeks after insertion.   If pain is severe, or if heavy bleeding occurs, or if foul-smelling discharge or fever develops, or if you have any other concerns - contact your doctor right away or go to the Emergency Room.  IUD's are a very reliable method of birth control, but no method is 100% effective. If you think you may be pregnant, see your doctor right away.   An IUD will not protect you from sexually transmitted infections such as HIV, gonorrhea, chlamydia, HPV and others.   It is recommended that you see your doctor as directed for routine well-woman care, which includes Pap testing and may include screening for infections.

## 2017-04-27 NOTE — Progress Notes (Signed)
HPI: Hannah Duncan is a 22 y.o. female who presents to [facility] today 04/27/17 for chief complaint of:  Chief Complaint  Patient presents with  . Contraception    ParaGuad   Here for IUD insertion. See procedure note below.   Past medical, social and family history reviewed. Medications and allergies reviwed.        Review of Systems: CONSTITUTIONAL:  No  fever, no chills CARDIAC: No  chest pain, RESPIRATORY: No  cough, No  shortness of breath/wheeze GASTROINTESTINAL: No  nausea, No  vomiting, No  abdominal pain GENITOURINARY: No  incontinence, No  abnormal genital bleeding/discharge SKIN: No  rash/wounds/concerning lesions   Exam:  BP 123/74 (BP Location: Right Arm, Patient Position: Sitting, Cuff Size: Large)   Pulse (!) 104   Temp 98.6 F (37 C) (Oral)   Resp 18   Wt 141 lb 3.2 oz (64 kg)   LMP 04/22/2017 (Exact Date)   SpO2 100%   BMI 23.50 kg/m  Constitutional: VS see above. General Appearance: alert, well-developed, well-nourished, NAD Psychiatric: Normal judgment/insight. Normal mood and affect. Oriented x3.  GYN: see procedure note below  Results for orders placed or performed in visit on 04/27/17 (from the past 24 hour(s))  POCT urine pregnancy     Status: Normal   Collection Time: 04/27/17 10:22 AM  Result Value Ref Range   Preg Test, Ur Negative Negative      ASSESSMENT/PLAN: The encounter diagnosis was Encounter for insertion of ParaGard IUD.   Patient was counseled on the risks versus benefits of IUD contraception, including excellent contraceptive efficacy but risk of uterine perforation, small chance of ascending infection, irregular bleeding, and others. In light of her preferences, previous contraception experience, other risk factors as noted in history of present illness. Patient opts to proceed with insertion of Paragard IUD.   Patient was educated on the process for insertion, including premedication with  ibuprofen, what to expect from vaginal exam and insertion process, reasons that we would abort the procedure such as abnormal anatomy, non-passage of the uterine sound, patient inability to tolerate the procedure, or other.  Procedure went well, RTC string check   IUD PROCEDURE NOTE  PERTINENT RESULTS REVIEWED: PREGNANCY TEST PRIOR TO PROCEDURE: Negative GONORRHEA/CHLAMYDIA SCREEN: Negative  PRIOR TO PROCEDURE: INFORMED CONSENT OBTAINED: yes  ANY PRETREATMENT: Ibuprofen 800 mg  PHYSICAL EXAM: GYN: No lesions/ulcers to external genitalia, normal urethra, normal vaginal mucosa, physiologic discharge, cervix normal without lesions, uterus not enlarged or tender, adnexa no masses and nontender  DESCRIPTION OF PROCEDURE: Vaginal speculum placed. Cervix and proximal vagina cleaned with Betadine.Tenaculum applied at 12:00 cervical position and gentle traction applied. Uterus sounded to 7 cm (greater than 6cm). IUD placed without difficulty. IUD threads cut to 3cm from cervical os. Tenaculum and speculum removed. Patient felt strings. Patient tolerated procedure well. Sterile technique maintained.   IUD INFORMATION: BRAND: Paragard LOT NUMBER: [id number], exp 09/2022 CARD GIVEN TO PATIENT: yes   Patient Instructions  IUD AFTER-CARE INSTRUCTIONS: READ THOROUGHLY  Your Paragard IUD is currently approved to remain in place for 10 years. At that time, if you wish to receive a new IUD, this can be placed when your current one is removed. If you wish to remove your IUD at any time, for any reason, this can be done easily by your doctor.   You should feel for the strings to your IUD routinely. If you cannot locate the strings, it is recommended you alert your doctor of  this.   Be aware that in the first few weeks, your new IUD may cause some discomfort/cramping as it settles into place in your uterus. To ease discomfort, you may apply heating pad to abdomen and take Ibuprofen 800mg  by mouth every 6  hours as needed, but avoid using this dose continuously for more than 5 days. Walking helps as well. You can also expect some irregular bleeding but it should not be heavy for more than a few days, though some bleeding can last up to 4-6 weeks after insertion.   If pain is severe, or if heavy bleeding occurs, or if foul-smelling discharge or fever develops, or if you have any other concerns - contact your doctor right away or go to the Emergency Room.  IUD's are a very reliable method of birth control, but no method is 100% effective. If you think you may be pregnant, see your doctor right away.   An IUD will not protect you from sexually transmitted infections such as HIV, gonorrhea, chlamydia, HPV and others.   It is recommended that you see your doctor as directed for routine well-woman care, which includes Pap testing and may include screening for infections.      All questions were answered. Visit summary with updated medication list and pertinent instructions was printed for patient. ER/RTC precautions were reviewed with the patient. Return for IUD string check 4 weeks as directed, sooner if needed

## 2017-05-01 ENCOUNTER — Encounter: Payer: Self-pay | Admitting: Osteopathic Medicine

## 2017-05-12 MED FILL — TRANSDERM-SCOP 1.5 MG/3 DAY: 1 | 12 days supply | Qty: 4 | Fill #1

## 2017-05-13 ENCOUNTER — Encounter: Payer: Self-pay | Admitting: Osteopathic Medicine

## 2017-05-13 DIAGNOSIS — S8990XA Unspecified injury of unspecified lower leg, initial encounter: Secondary | ICD-10-CM

## 2017-05-17 MED FILL — METHYLPHENIDATE 20 MG TAB: 20 | 30 days supply | Qty: 60 | Fill #0

## 2017-05-22 ENCOUNTER — Ambulatory Visit (INDEPENDENT_AMBULATORY_CARE_PROVIDER_SITE_OTHER): Payer: No Typology Code available for payment source | Admitting: Orthopaedic Surgery

## 2017-05-22 ENCOUNTER — Encounter (INDEPENDENT_AMBULATORY_CARE_PROVIDER_SITE_OTHER): Payer: Self-pay | Admitting: Orthopaedic Surgery

## 2017-05-22 DIAGNOSIS — S8002XD Contusion of left knee, subsequent encounter: Secondary | ICD-10-CM | POA: Diagnosis not present

## 2017-05-22 DIAGNOSIS — S8002XA Contusion of left knee, initial encounter: Secondary | ICD-10-CM | POA: Insufficient documentation

## 2017-05-22 HISTORY — DX: Contusion of left knee, initial encounter: S80.02XA
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
## 2017-05-25 ENCOUNTER — Ambulatory Visit (INDEPENDENT_AMBULATORY_CARE_PROVIDER_SITE_OTHER): Payer: No Typology Code available for payment source | Admitting: Osteopathic Medicine

## 2017-05-25 ENCOUNTER — Encounter: Payer: Self-pay | Admitting: Osteopathic Medicine

## 2017-05-25 VITALS — BP 128/72 | HR 75 | Temp 98.5°F | Wt 139.0 lb

## 2017-05-25 DIAGNOSIS — Z30431 Encounter for routine checking of intrauterine contraceptive device: Secondary | ICD-10-CM

## 2017-05-25 NOTE — Progress Notes (Signed)
S: IUD string check needed today. Doing well with the ParaGard, no problems with strings poking, she can feel them herself  O: Normal vaginal exam, IUD strings visible through cervical os.   A/P: IUD string check ok, pt does not need strings trimmed, f/u as needed for any IUD problems or suspected pregnancy, and as directed for routine care .

## 2017-06-03 ENCOUNTER — Other Ambulatory Visit (HOSPITAL_BASED_OUTPATIENT_CLINIC_OR_DEPARTMENT_OTHER): Payer: No Typology Code available for payment source

## 2017-06-05 MED FILL — buPROPion HCL ER (XL) 150 M: 150 | 90 days supply | Qty: 90 | Fill #1

## 2017-06-10 ENCOUNTER — Ambulatory Visit (HOSPITAL_BASED_OUTPATIENT_CLINIC_OR_DEPARTMENT_OTHER)
Admission: RE | Admit: 2017-06-10 | Discharge: 2017-06-10 | Disposition: A | Discharge status: Home / Self Care | Payer: No Typology Code available for payment source | Source: Ambulatory Visit | Attending: Physician Assistant | Admitting: Physician Assistant

## 2017-06-10 DIAGNOSIS — S8002XD Contusion of left knee, subsequent encounter: Secondary | ICD-10-CM | POA: Diagnosis not present

## 2017-06-10 DIAGNOSIS — X58XXXD Exposure to other specified factors, subsequent encounter: Secondary | ICD-10-CM | POA: Diagnosis not present

## 2017-06-12 ENCOUNTER — Encounter (INDEPENDENT_AMBULATORY_CARE_PROVIDER_SITE_OTHER): Payer: Self-pay | Admitting: Physician Assistant

## 2017-07-05 ENCOUNTER — Encounter: Payer: Self-pay | Admitting: Osteopathic Medicine

## 2017-07-05 ENCOUNTER — Ambulatory Visit (INDEPENDENT_AMBULATORY_CARE_PROVIDER_SITE_OTHER): Payer: No Typology Code available for payment source | Admitting: Osteopathic Medicine

## 2017-07-05 VITALS — BP 124/86 | HR 111 | Temp 97.9°F | Wt 141.9 lb

## 2017-07-05 DIAGNOSIS — F909 Attention-deficit hyperactivity disorder, unspecified type: Secondary | ICD-10-CM

## 2017-07-05 MED ORDER — METHYLPHENIDATE HCL 20 MG PO TABS: 20.0000 mg | ORAL_TABLET | Freq: Two times a day (BID) | ORAL | 0 refills | Status: DC

## 2017-07-05 NOTE — Progress Notes (Signed)
HPI: Hannah Duncan is a 22 y.o. female  who presents to [facility] [place] today, 07/05/17,  for chief complaint of:  ADHD follow-up and refills    ADHD: we switched to Ritalin bid at a previous visit d/t cost of Concerta. She has been doing well on this medication past several visits. Doing okay in school, finals are coming up!   Depression: Doing well on Wellbutrin.   Past medical history, surgical history, social history and family history reviewed.  Patient Active Problem List   Diagnosis Date Noted  . Contusion of left knee 05/22/2017  . Moderate episode of recurrent major depressive disorder (HCC) 12/27/2016  . Pes cavus 06/07/2016  . Encounter for contraceptive management 10/01/2015  . Attention deficit hyperactivity disorder (ADHD) 07/21/2015    Current medication list and allergy/intolerance information reviewed.   Current Outpatient Medications on File Prior to Visit  Medication Sig Dispense Refill  . Ascorbic Acid (VITAMIN C) 100 MG tablet Take 100 mg by mouth daily.    Marland Kitchen. buPROPion (WELLBUTRIN XL) 150 MG 24 hr tablet Take 1 tablet (150 mg total) by mouth every morning. 90 tablet 1  . cholecalciferol (VITAMIN D) 1000 units tablet Take 1,000 Units by mouth daily.    . Collagen 500 MG CAPS Take by mouth.    . Cranberry 125 MG TABS Take by mouth.    . methylphenidate (RITALIN) 20 MG tablet Take 1 tablet (20 mg total) by mouth 2 (two) times daily with breakfast and lunch. 60 tablet 0  . NIACINAMIDE-ZINC-COPPER-FA PO Take by mouth.    Marland Kitchen. scopolamine (TRANSDERM-SCOP, 1.5 MG,) 1 MG/3DAYS Place 1 patch (1.5 mg total) onto the skin every 3 (three) days. 4 patch 1  . vitamin E 100 UNIT capsule Take by mouth daily.     No current facility-administered medications on file prior to visit.    No Known Allergies    Review of Systems:  Constitutional: No recent illness  Cardiac: No  chest pain, No  pressure  Psychiatric: No concerns with depression,  No  concerns with anxiety  Exam:  BP 124/86   Pulse (!) 111   Temp 97.9 F (36.6 C) (Oral)   Wt 141 lb 14.4 oz (64.4 kg)   BMI 23.61 kg/m   Constitutional: VS see above. General Appearance: alert, well-developed, well-nourished, NAD  Eyes: Normal lids and conjunctive, non-icteric sclera  Ears, Nose, Mouth, Throat: MMM, Normal external inspection ears/nares/mouth/lips/gums.  Neck: No masses, trachea midline.   Respiratory: Normal respiratory effort. no wheeze, no rhonchi, no rales  Cardiovascular: S1/S2 normal, no murmur, no rub/gallop auscultated. RRR.   Musculoskeletal: Gait normal. Symmetric and independent movement of all extremities  Neurological: Normal balance/coordination. No tremor.  Skin: warm, dry, intact.   Psychiatric: Normal judgment/insight. Normal mood and affect. Oriented x3.      ASSESSMENT/PLAN:   Attention deficit hyperactivity disorder (ADHD), unspecified ADHD type - prefers printed Rx - 3 Rx given to patient today  - Plan: methylphenidate (RITALIN) 20 MG tablet, DISCONTINUED: methylphenidate (RITALIN) 20 MG tablet, DISCONTINUED: methylphenidate (RITALIN) 20 MG tablet     Follow-up plan: Return in about 6 months (around 01/04/2018) for adhd med refills, sooner if needed. Pt ok to send a MyChart message in 3 mos and I can arrange 3 printed Rx for her to pick up at the front desk.   Visit summary with medication list and pertinent instructions was printed for patient to review, alert us if any changes needed. All questions at  time of visit were answered - patient instructed to contact office with any additional concerns. ER/RTC precautions were reviewed with the patient and understanding verbalized.

## 2017-08-08 ENCOUNTER — Encounter: Payer: Self-pay | Admitting: Osteopathic Medicine

## 2017-08-08 DIAGNOSIS — F331 Major depressive disorder, recurrent, moderate: Secondary | ICD-10-CM

## 2017-08-09 MED ORDER — BUPROPION HCL ER (XL) 150 MG PO TB24: 150.0000 mg | ORAL_TABLET | ORAL | 1 refills | Status: DC

## 2017-08-16 MED FILL — buPROPion HCL ER (XL) 150 M: 150 | 90 days supply | Qty: 90 | Fill #0

## 2017-08-21 MED FILL — METHYLPHENIDATE 20 MG TAB: 20 | 30 days supply | Qty: 60 | Fill #0

## 2017-11-20 ENCOUNTER — Encounter: Payer: Self-pay | Admitting: Osteopathic Medicine

## 2017-12-27 MED FILL — buPROPion HCL ER (XL) 150 M: 150 | 90 days supply | Qty: 90 | Fill #1

## 2018-01-04 ENCOUNTER — Ambulatory Visit: Payer: No Typology Code available for payment source | Admitting: Osteopathic Medicine

## 2018-01-12 ENCOUNTER — Encounter: Payer: Self-pay | Admitting: Osteopathic Medicine

## 2018-01-12 ENCOUNTER — Ambulatory Visit (INDEPENDENT_AMBULATORY_CARE_PROVIDER_SITE_OTHER): Payer: No Typology Code available for payment source | Admitting: Osteopathic Medicine

## 2018-01-12 VITALS — BP 124/69 | HR 75 | Temp 98.6°F | Wt 151.7 lb

## 2018-01-12 DIAGNOSIS — F909 Attention-deficit hyperactivity disorder, unspecified type: Secondary | ICD-10-CM | POA: Diagnosis not present

## 2018-01-12 DIAGNOSIS — F3342 Major depressive disorder, recurrent, in full remission: Secondary | ICD-10-CM | POA: Diagnosis not present

## 2018-01-12 MED ORDER — METHYLPHENIDATE HCL 20 MG PO TABS: 20.0000 mg | ORAL_TABLET | Freq: Two times a day (BID) | ORAL | 0 refills | Status: DC

## 2018-01-12 MED ORDER — BUPROPION HCL ER (XL) 150 MG PO TB24: 150.0000 mg | ORAL_TABLET | ORAL | 3 refills | Status: DC

## 2018-01-12 NOTE — Progress Notes (Signed)
HPI: Hannah Duncan is a 22 y.o. female  who presents to [facility] today, 01/12/18,  for chief complaint of:  ADHD follow-up and refills    ADHD: we switched to Ritalin bid at a previous visit d/t cost of Concerta. She has been doing well on this medication past several visits. 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 Was off the meds for a time this summer but needs refills now.   Depression: Doing well on Wellbutrin.   Past medical history, surgical history, social history and family history reviewed.  Patient Active Problem List   Diagnosis Date Noted  . Contusion of left knee 05/22/2017  . Moderate episode of recurrent major depressive disorder (HCC) 12/27/2016  . Pes cavus 06/07/2016  . Encounter for contraceptive management 10/01/2015  . Attention deficit hyperactivity disorder (ADHD) 07/21/2015    Current medication list and allergy/intolerance information reviewed.   Current Outpatient Medications on File Prior to Visit  Medication Sig Dispense Refill  . Ascorbic Acid (VITAMIN C) 100 MG tablet Take 100 mg by mouth daily.    Marland Kitchen buPROPion (WELLBUTRIN XL) 150 MG 24 hr tablet Take 1 tablet (150 mg total) by mouth every morning. 90 tablet 1  . cholecalciferol (VITAMIN D) 1000 units tablet Take 1,000 Units by mouth daily.    . Collagen 500 MG CAPS Take by mouth.    . Cranberry 125 MG TABS Take by mouth.    . methylphenidate (RITALIN) 20 MG tablet Take 1 tablet (20 mg total) by mouth 2 (two) times daily with breakfast and lunch. 60 tablet 0  . NIACINAMIDE-ZINC-COPPER-FA PO Take by mouth.    Marland Kitchen scopolamine (TRANSDERM-SCOP, 1.5 MG,) 1 MG/3DAYS Place 1 patch (1.5 mg total) onto the skin every 3 (three) days. 4 patch 1  . vitamin E 100 UNIT capsule Take by mouth daily.     No current facility-administered medications on file prior to visit.    No Known Allergies    Review of Systems:  Constitutional: No recent illness  Cardiac: No   chest pain, No  pressure  Psychiatric: No concerns with depression, No  concerns with anxiety  Exam:  BP 124/69 (BP Location: Left Arm, Patient Position: Sitting, Cuff Size: Normal)   Pulse 75   Temp 98.6 F (37 C) (Oral)   Wt 151 lb 11.2 oz (68.8 kg)   BMI 25.24 kg/m   Constitutional: VS see above. General Appearance: alert, well-developed, well-nourished, NAD  Eyes: Normal lids and conjunctive, non-icteric sclera  Ears, Nose, Mouth, Throat: MMM, Normal external inspection ears/nares/mouth/lips/gums.  Neck: No masses, trachea midline.   Respiratory: Normal respiratory effort. no wheeze, no rhonchi, no rales  Cardiovascular: S1/S2 normal, no murmur, no rub/gallop auscultated. RRR.   Musculoskeletal: Gait normal. Symmetric and independent movement of all extremities  Neurological: Normal balance/coordination. No tremor.  Skin: warm, dry, intact.   Psychiatric: Normal judgment/insight. Normal mood and affect. Oriented x3.    PMP reviewed:  Fill Date ID Written Drug Qty Days Prescriber Rx # Pharmacy Refill Daily Dose * Pymt Type PMP  08/21/2017 1 08/04/2017 Methylphenidate 20 Mg Tablet 60.00 30 Na Ale [id number] Wes ([id number]) 0/0  [facility] [place]  05/17/2017 1 05/06/2017 Methylphenidate 20 Mg Tablet 60.00 30 Na Ale [id number] Wes ([id number]) 0/0  [facility] [place]  01/25/2017 1 12/26/2016 Methylphenidate 20 Mg Tablet 60.00 30 Na Ale [id number] Wes ([id number]) 0/0  [facility] [place]  12/26/2016 1 12/26/2016 Methylphenidate 20 Mg Tablet 60.00 30  Na Ale [id number] Wes ([id number]) 0/0  [facility] [place]  12/06/2016 1 09/15/2016 Methylphenidate 10 Mg Tablet 60.00 30 Na Ale [id number] Med ([id number]) 0/0  [facility] [place]  11/03/2016 1 09/15/2016 Methylphenidate 10 Mg Tablet     ASSESSMENT/PLAN:   Attention deficit hyperactivity disorder (ADHD), unspecified ADHD type  Recurrent major depressive disorder, in full remission (HCC)   Meds ordered this encounter  Medications  . buPROPion (WELLBUTRIN XL) 150 MG 24 hr tablet    Sig:  Take 1 tablet (150 mg total) by mouth every morning.    Dispense:  90 tablet    Refill:  3  . DISCONTD: methylphenidate (RITALIN) 20 MG tablet    Sig: Take 1 tablet (20 mg total) by mouth 2 (two) times daily with breakfast and lunch.    Dispense:  60 tablet    Refill:  0  . DISCONTD: methylphenidate (RITALIN) 20 MG tablet    Sig: Take 1 tablet (20 mg total) by mouth 2 (two) times daily with breakfast and lunch.    Dispense:  60 tablet    Refill:  0  . methylphenidate (RITALIN) 20 MG tablet    Sig: Take 1 tablet (20 mg total) by mouth 2 (two) times daily with breakfast and lunch.    Dispense:  60 tablet    Refill:  0      Immunization History  Administered Date(s) Administered  . HPV Quadrivalent 03/24/2008, 06/03/2009, 09/21/2009  . Influenza Inj Mdck Quad Pf 12/05/2016, 12/01/2017  . Influenza, Seasonal, Injecte, Preservative Fre 11/27/2015  . PPD Test 07/10/2015, 07/21/2015  . Tdap 03/15/2007     Follow-up plan: Return in about 6 months (around 07/13/2018) for [facility] PHYSICAL AND ADHD REFILLS, sooner if needed! . Pt ok to send a MyChart message in 3 mos and I can arrange 3 printed Rx for her to pick up at the front desk.   Visit summary with medication list and pertinent instructions was printed for patient to review, alert [place] if any changes needed. All questions at time of visit were answered - patient instructed to contact office with any additional concerns. ER/RTC precautions were reviewed with the patient and understanding verbalized.

## 2018-03-26 ENCOUNTER — Ambulatory Visit: Payer: No Typology Code available for payment source | Admitting: Osteopathic Medicine

## 2018-04-16 MED FILL — buPROPion HCL ER (XL) 150 M: 150 | 90 days supply | Qty: 90 | Fill #0

## 2018-05-01 ENCOUNTER — Encounter: Payer: Self-pay | Admitting: Osteopathic Medicine

## 2018-05-03 ENCOUNTER — Ambulatory Visit: Payer: No Typology Code available for payment source | Admitting: Family Medicine

## 2018-05-09 ENCOUNTER — Encounter: Payer: Self-pay | Admitting: Family Medicine

## 2018-05-09 ENCOUNTER — Ambulatory Visit (INDEPENDENT_AMBULATORY_CARE_PROVIDER_SITE_OTHER): Payer: No Typology Code available for payment source | Admitting: Family Medicine

## 2018-05-09 VITALS — BP 110/70 | HR 71 | Temp 97.3°F | Ht 65.0 in | Wt 154.4 lb

## 2018-05-09 DIAGNOSIS — Z23 Encounter for immunization: Secondary | ICD-10-CM | POA: Diagnosis not present

## 2018-05-09 DIAGNOSIS — Z30011 Encounter for initial prescription of contraceptive pills: Secondary | ICD-10-CM

## 2018-05-09 DIAGNOSIS — L700 Acne vulgaris: Secondary | ICD-10-CM | POA: Diagnosis not present

## 2018-05-09 MED ORDER — DROSPIRENONE-ETHINYL ESTRADIOL 3-0.02 MG PO TABS: 1.0000 | ORAL_TABLET | Freq: Every day | ORAL | 3 refills | Status: DC

## 2018-05-09 MED ORDER — SPIRONOLACTONE 25 MG PO TABS: 25.0000 mg | ORAL_TABLET | Freq: Every day | ORAL | 3 refills | Status: DC

## 2018-05-09 MED FILL — SPIRONOLACTONE 25 MG TABS: 25 | 90 days supply | Qty: 90 | Fill #0

## 2018-05-09 MED FILL — LORYNA 3-0.02 MG TABS: 3-0.02 | 84 days supply | Qty: 84 | Fill #0

## 2018-05-09 NOTE — Progress Notes (Signed)
Hannah Duncan is a 23 y.o. female  Chief Complaint  Patient presents with  . Establish Care    est care/ med consult? (copper IUD) wants to switch out for Yaz/ wants T Dap    HPI: Hannah Duncan is a 23 y.o. female here to establish care with our office. She is an ICU nurse at [employer].  She states she has a copper IUD x 1 year but would like that removed d/t painful and heavier periods.  She has acne and would like to try Yaz along with spironolactone. She was on Accutane in the past but states her skin went back to her baseline 3 mo after stopping the 6 mo course. She has done "all of the topical treatments". She has not taken oral abx for acne but does not want to do so.  Pt would like Tdap today.   No past medical history on file.  Past Surgical History:  Procedure Laterality Date  . WISDOM TOOTH EXTRACTION  2012    Social History   Socioeconomic History  . Marital status: Significant Other    Spouse name: Not on file  . Number of children: Not on file  . Years of education: Not on file  . Highest education level: Not on file  Occupational History  . Not on file  Social Needs  . Financial resource strain: Not on file  . Food insecurity:    Worry: Not on file    Inability: Not on file  . Transportation needs:    Medical: Not on file    Non-medical: Not on file  Tobacco Use  . Smoking status: Never Smoker  . Smokeless tobacco: Never Used  Substance and Sexual Activity  . Alcohol use: Yes    Frequency: Never    Comment: occasional  . Drug use: Never  . Sexual activity: Not on file  Lifestyle  . Physical activity:    Days per week: Not on file    Minutes per session: Not on file  . Stress: Not on file  Relationships  . Social connections:    Talks on phone: Not on file    Gets together: Not on file    Attends religious service: Not on file    Active member of club or organization: Not on file    Attends meetings of clubs or organizations: Not on file   Relationship status: Not on file  . Intimate partner violence:    Fear of current or ex partner: Not on file    Emotionally abused: Not on file    Physically abused: Not on file    Forced sexual activity: Not on file  Other Topics Concern  . Not on file  Social History Narrative  . Not on file    Family History  Problem Relation Age of Onset  . Cancer Mother        melanoma  . Hyperlipidemia Father   . Cancer Maternal Grandmother        breast cancer  . Cancer Maternal Grandfather        skin cancer     Immunization History  Administered Date(s) Administered  . HPV Quadrivalent 03/24/2008, 06/03/2009, 09/21/2009  . Influenza Inj Mdck Quad Pf 12/05/2016, 12/01/2017  . Influenza, Seasonal, Injecte, Preservative Fre 11/27/2015  . PPD Test 07/10/2015, 07/21/2015  . Tdap 03/15/2007    Outpatient Encounter Medications as of 05/09/2018  Medication Sig  . buPROPion (WELLBUTRIN XL) 150 MG 24 hr tablet Take 1 tablet (  150 mg total) by mouth every morning.  . Ascorbic Acid (VITAMIN C) 100 MG tablet Take 100 mg by mouth daily.  . cholecalciferol (VITAMIN D) 1000 units tablet Take 1,000 Units by mouth daily.  . Cranberry 125 MG TABS Take by mouth.  . drospirenone-ethinyl estradiol (YAZ,GIANVI,LORYNA) 3-0.02 MG tablet Take 1 tablet by mouth daily.  Marland Kitchen NIACINAMIDE-ZINC-COPPER-FA PO Take by mouth.  Marland Kitchen scopolamine (TRANSDERM-SCOP, 1.5 MG,) 1 MG/3DAYS Place 1 patch (1.5 mg total) onto the skin every 3 (three) days.  Marland Kitchen spironolactone (ALDACTONE) 25 MG tablet Take 1 tablet (25 mg total) by mouth daily.  . vitamin E 100 UNIT capsule Take by mouth daily.  . [DISCONTINUED] Collagen 500 MG CAPS Take by mouth.  . [DISCONTINUED] methylphenidate (RITALIN) 20 MG tablet Take 1 tablet (20 mg total) by mouth 2 (two) times daily with breakfast and lunch.   No facility-administered encounter medications on file as of 05/09/2018.      ROS: Gen: no fever, chills  Skin: facial acne ENT: no ear pain, ear  drainage, nasal congestion, rhinorrhea, sinus pressure, sore throat Eyes: no blurry vision, double vision Resp: no cough, wheeze,SOB CV: no CP, palpitations, LE edema,  GI: no heartburn, n/v/d/c, abd pain GU: no dysuria, urgency, frequency, hematuria  MSK: no joint pain, myalgias, back pain Neuro: no dizziness, headache, weakness, vertigo Psych: no depression, anxiety, insomnia   No Known Allergies  BP 110/70   Pulse 71   Temp (!) 97.3 F (36.3 C) (Oral)   Ht 5\' 5"  (1.651 m)   Wt 154 lb 6.4 oz (70 kg)   SpO2 99%   BMI 25.69 kg/m   Physical Exam  Constitutional: She is oriented to person, place, and time. She appears well-developed and well-nourished. No distress.  Neck: Neck supple.  Cardiovascular: Normal rate, regular rhythm and normal heart sounds.  Pulmonary/Chest: Effort normal and breath sounds normal. No respiratory distress.  Lymphadenopathy:    She has no cervical adenopathy.  Neurological: She is alert and oriented to person, place, and time.  Skin:  comedonal facial acne  Psychiatric: She has a normal mood and affect. Her behavior is normal.     A/P:  1. Need for Tdap vaccination - Tdap vaccine greater than or equal to 7yo IM  2. BCP (birth control pills) initiation - will schedule appt for Paraguard (copper) IUD removal Rx: - drospirenone-ethinyl estradiol (YAZ,GIANVI,LORYNA) 3-0.02 MG tablet; Take 1 tablet by mouth daily.  Dispense: 3 Package; Refill: 3  3. Acne vulgaris Rx: - drospirenone-ethinyl estradiol (YAZ,GIANVI,LORYNA) 3-0.02 MG tablet; Take 1 tablet by mouth daily.  Dispense: 3 Package; Refill: 3 - spironolactone (ALDACTONE) 25 MG tablet; Take 1 tablet (25 mg total) by mouth daily.  Dispense: 90 tablet; Refill: 3 - f/u in 3-4 mo or sooner PRN Discussed plan and reviewed medications with patient, including risks, benefits, and potential side effects. Pt expressed understand. All questions answered.

## 2018-05-23 ENCOUNTER — Ambulatory Visit: Payer: No Typology Code available for payment source | Admitting: Family Medicine

## 2018-05-30 ENCOUNTER — Other Ambulatory Visit: Payer: Self-pay

## 2018-05-30 ENCOUNTER — Encounter: Payer: Self-pay | Admitting: Family Medicine

## 2018-05-30 ENCOUNTER — Ambulatory Visit (INDEPENDENT_AMBULATORY_CARE_PROVIDER_SITE_OTHER): Payer: No Typology Code available for payment source | Admitting: Family Medicine

## 2018-05-30 VITALS — BP 102/62 | HR 88 | Temp 98.7°F | Ht 65.0 in | Wt 150.0 lb

## 2018-05-30 DIAGNOSIS — Z30432 Encounter for removal of intrauterine contraceptive device: Secondary | ICD-10-CM | POA: Diagnosis not present

## 2018-05-30 NOTE — Progress Notes (Signed)
Hannah Duncan is a 23 y.o. female  Chief Complaint  Patient presents with  . Contraception    removal of IUD    HPI: Hannah Duncan is a 23 y.o. female here to have her copper (paraguard) IUD removed. She has had it for about 1 year but would like it removed d/t more painful and heavier periods than she had prior to getting the IUD.   No past medical history on file.  Past Surgical History:  Procedure Laterality Date  . WISDOM TOOTH EXTRACTION  2012    Social History   Socioeconomic History  . Marital status: Significant Other    Spouse name: Not on file  . Number of children: Not on file  . Years of education: Not on file  . Highest education level: Not on file  Occupational History  . Not on file  Social Needs  . Financial resource strain: Not on file  . Food insecurity:    Worry: Not on file    Inability: Not on file  . Transportation needs:    Medical: Not on file    Non-medical: Not on file  Tobacco Use  . Smoking status: Never Smoker  . Smokeless tobacco: Never Used  Substance and Sexual Activity  . Alcohol use: Yes    Frequency: Never    Comment: occasional  . Drug use: Never  . Sexual activity: Not on file  Lifestyle  . Physical activity:    Days per week: Not on file    Minutes per session: Not on file  . Stress: Not on file  Relationships  . Social connections:    Talks on phone: Not on file    Gets together: Not on file    Attends religious service: Not on file    Active member of club or organization: Not on file    Attends meetings of clubs or organizations: Not on file    Relationship status: Not on file  . Intimate partner violence:    Fear of current or ex partner: Not on file    Emotionally abused: Not on file    Physically abused: Not on file    Forced sexual activity: Not on file  Other Topics Concern  . Not on file  Social History Narrative  . Not on file    Family History  Problem Relation Age of Onset  . Cancer Mother     melanoma  . Hyperlipidemia Father   . Cancer Maternal Grandmother        breast cancer  . Cancer Maternal Grandfather        skin cancer     Immunization History  Administered Date(s) Administered  . HPV Quadrivalent 03/24/2008, 06/03/2009, 09/21/2009  . Influenza Inj Mdck Quad Pf 12/05/2016, 12/01/2017  . Influenza, Seasonal, Injecte, Preservative Fre 11/27/2015  . PPD Test 07/10/2015, 07/21/2015  . Tdap 03/15/2007, 05/09/2018    Outpatient Encounter Medications as of 05/30/2018  Medication Sig  . buPROPion (WELLBUTRIN XL) 150 MG 24 hr tablet Take 1 tablet (150 mg total) by mouth every morning.  . drospirenone-ethinyl estradiol (YAZ,GIANVI,LORYNA) 3-0.02 MG tablet Take 1 tablet by mouth daily.  Marland Kitchen spironolactone (ALDACTONE) 25 MG tablet Take 1 tablet (25 mg total) by mouth daily.  . [DISCONTINUED] Ascorbic Acid (VITAMIN C) 100 MG tablet Take 100 mg by mouth daily.  . [DISCONTINUED] cholecalciferol (VITAMIN D) 1000 units tablet Take 1,000 Units by mouth daily.  . [DISCONTINUED] Cranberry 125 MG TABS Take by mouth.  . [DISCONTINUED]  NIACINAMIDE-ZINC-COPPER-FA PO Take by mouth.  . [DISCONTINUED] scopolamine (TRANSDERM-SCOP, 1.5 MG,) 1 MG/3DAYS Place 1 patch (1.5 mg total) onto the skin every 3 (three) days.  . [DISCONTINUED] vitamin E 100 UNIT capsule Take by mouth daily.   No facility-administered encounter medications on file as of 05/30/2018.      ROS: Gen: no fever, chills  Resp: no cough, wheeze,SOB CV: no CP, palpitations, LE edema,  GI: no heartburn, n/v/d/c, abd pain GU: no dysuria, urgency, frequency, hematuria  MSK: no joint pain, myalgias, back pain Neuro: no dizziness, headache, weakness    No Known Allergies  BP 102/62   Pulse 88   Temp 98.7 F (37.1 C) (Oral)   Ht 5\' 5"  (1.651 m)   Wt 150 lb (68 kg)   SpO2 99%   BMI 24.96 kg/m   Physical Exam  Constitutional: She is oriented to person, place, and time. She appears well-developed and well-nourished.  No distress.  Genitourinary:    Genitourinary Comments: speculum exam performed, IUD removed without issue   Neurological: She is alert and oriented to person, place, and time.  Psychiatric: She has a normal mood and affect. Her behavior is normal.     A/P:  1. Encounter for removal of intrauterine contraceptive device (IUD) - copper IUD removed intact without incident - pt tolerated without issue - f/u PRN

## 2018-06-01 ENCOUNTER — Telehealth: Payer: No Typology Code available for payment source | Admitting: Family

## 2018-06-01 DIAGNOSIS — N390 Urinary tract infection, site not specified: Secondary | ICD-10-CM | POA: Diagnosis not present

## 2018-06-01 MED ORDER — CEPHALEXIN 500 MG PO CAPS: 500.0000 mg | ORAL_CAPSULE | Freq: Two times a day (BID) | ORAL | 0 refills | Status: DC

## 2018-06-01 MED FILL — CEPHALEXIN 500 MG CAPSULE: 500 | 7 days supply | Qty: 14 | Fill #0

## 2018-06-01 NOTE — Progress Notes (Signed)
Greater than 5 minutes, yet less than 10 minutes of time have been spent researching, coordinating, and implementing care for this patient today.  Thank you for the details you included in the comment boxes. Those details are very helpful in determining the best course of treatment for you and help [place] to provide the best care.  We don't use Bactrim anymore (hardly ever) due to current evidence. See plan below.  We are sorry that you are not feeling well.  Here is how we plan to help!  Based on what you shared with me it looks like you most likely have a simple urinary tract infection.  A UTI (Urinary Tract Infection) is a bacterial infection of the bladder.  Most cases of urinary tract infections are simple to treat but a key part of your care is to encourage you to drink plenty of fluids and watch your symptoms carefully.  I have prescribed Keflex 500 mg twice a day for 7 days.  Your symptoms should gradually improve. Call us if the burning in your urine worsens, you develop worsening fever, back pain or pelvic pain or if your symptoms do not resolve after completing the antibiotic.  Urinary tract infections can be prevented by drinking plenty of water to keep your body hydrated.  Also be sure when you wipe, wipe from front to back and don't hold it in!  If possible, empty your bladder every 4 hours.  Your e-visit answers were reviewed by a board certified advanced clinical practitioner to complete your personal care plan.  Depending on the condition, your plan could have included both over the counter or prescription medications.  If there is a problem please reply  once you have received a response from your provider.  Your safety is important to [place].  If you have drug allergies check your prescription carefully.    You can use MyChart to ask questions about today's visit, request a non-urgent call back, or ask for a work or school excuse for 24 hours related to this e-Visit. If it has been  greater than 24 hours you will need to follow up with your provider, or enter a new e-Visit to address those concerns.   You will get an e-mail in the next two days asking about your experience.  I hope that your e-visit has been valuable and will speed your recovery. Thank you for using e-visits.

## 2018-07-27 MED FILL — DROSPIR-ETH ESTRA 3/.02 MG: 3-0.02 | 84 days supply | Qty: 84 | Fill #1

## 2018-07-27 MED FILL — SPIRONOLACTONE 25 MG TABS: 25 | 90 days supply | Qty: 90 | Fill #1

## 2018-07-27 MED FILL — buPROPion HCL ER (XL) 150 M: 150 | 90 days supply | Qty: 90 | Fill #1

## 2018-08-10 ENCOUNTER — Encounter: Payer: No Typology Code available for payment source | Admitting: Osteopathic Medicine

## 2018-08-23 ENCOUNTER — Ambulatory Visit (INDEPENDENT_AMBULATORY_CARE_PROVIDER_SITE_OTHER): Payer: No Typology Code available for payment source | Admitting: Family Medicine

## 2018-08-23 ENCOUNTER — Encounter: Payer: Self-pay | Admitting: Family Medicine

## 2018-08-23 VITALS — BP 100/70 | HR 91 | Temp 98.1°F | Ht 65.0 in | Wt 145.4 lb

## 2018-08-23 DIAGNOSIS — Z0000 Encounter for general adult medical examination without abnormal findings: Secondary | ICD-10-CM

## 2018-08-23 NOTE — Patient Instructions (Signed)

## 2018-08-23 NOTE — Progress Notes (Signed)
Hannah Duncan is a 23 y.o. female  Chief Complaint  Patient presents with  . Annual Exam    CPE/ not fasting     HPI: Hannah Duncan is a 22 y.o. female here for annual CPE. She is not fasting for labs. She is UTD on immunizations and PAP. No issues or concerns or complaints today.   Last PAP: 09/2016 - normal. Next PAP in 09/2019  Diet/Exercise: healthy diet, no red meat; regular exercise  Med refills needed today? none  Tdap - 04/2018  History reviewed. No pertinent past medical history.  Past Surgical History:  Procedure Laterality Date  . WISDOM TOOTH EXTRACTION  2012    Social History   Socioeconomic History  . Marital status: Significant Other    Spouse name: Not on file  . Number of children: Not on file  . Years of education: Not on file  . Highest education level: Not on file  Occupational History  . Not on file  Social Needs  . Financial resource strain: Not on file  . Food insecurity    Worry: Not on file    Inability: Not on file  . Transportation needs    Medical: Not on file    Non-medical: Not on file  Tobacco Use  . Smoking status: Never Smoker  . Smokeless tobacco: Never Used  Substance and Sexual Activity  . Alcohol use: Yes    Frequency: Never    Comment: occasional  . Drug use: Never  . Sexual activity: Not on file  Lifestyle  . Physical activity    Days per week: Not on file    Minutes per session: Not on file  . Stress: Not on file  Relationships  . Social Herbalist on phone: Not on file    Gets together: Not on file    Attends religious service: Not on file    Active member of club or organization: Not on file    Attends meetings of clubs or organizations: Not on file    Relationship status: Not on file  . Intimate partner violence    Fear of current or ex partner: Not on file    Emotionally abused: Not on file    Physically abused: Not on file    Forced sexual activity: Not on file  Other Topics Concern  . Not on  file  Social History Narrative  . Not on file    Family History  Problem Relation Age of Onset  . Cancer Mother        melanoma  . Hyperlipidemia Father   . Cancer Maternal Grandmother        breast cancer  . Cancer Maternal Grandfather        skin cancer     Immunization History  Administered Date(s) Administered  . HPV Quadrivalent 03/24/2008, 06/03/2009, 09/21/2009  . Influenza Inj Mdck Quad Pf 12/05/2016, 12/01/2017  . Influenza, Seasonal, Injecte, Preservative Fre 11/27/2015  . PPD Test 07/10/2015, 07/21/2015  . Tdap 03/15/2007, 05/09/2018    Outpatient Encounter Medications as of 08/23/2018  Medication Sig  . buPROPion (WELLBUTRIN XL) 150 MG 24 hr tablet Take 1 tablet (150 mg total) by mouth every morning.  . drospirenone-ethinyl estradiol (YAZ,GIANVI,LORYNA) 3-0.02 MG tablet Take 1 tablet by mouth daily.  Marland Kitchen spironolactone (ALDACTONE) 25 MG tablet Take 1 tablet (25 mg total) by mouth daily.  . [DISCONTINUED] cephALEXin (KEFLEX) 500 MG capsule Take 1 capsule (500 mg total) by mouth 2 (two) times  daily. (Patient not taking: Reported on 08/23/2018)   No facility-administered encounter medications on file as of 08/23/2018.      ROS: Gen: no fever, chills  Skin: no rash, itching ENT: no ear pain, ear drainage, nasal congestion, rhinorrhea, sinus pressure, sore throat Eyes: no blurry vision, double vision Resp: no cough, wheeze,SOB Breast: no breast tenderness, no nipple discharge, no breast masses CV: no CP, palpitations, LE edema,  GI: no heartburn, n/v/d/c, abd pain GU: no dysuria, urgency, frequency, hematuria; no vaginal itching, odor, discharge MSK: no joint pain, myalgias, back pain Neuro: no dizziness, headache, weakness, vertigo Psych: no depression, anxiety, insomnia   No Known Allergies  BP 100/70   Pulse 91   Temp 98.1 F (36.7 C) (Oral)   Ht 5\' 5"  (1.651 m)   Wt 145 lb 6.4 oz (66 kg)   SpO2 99%   BMI 24.20 kg/m      Physical Exam   Constitutional: She is oriented to person, place, and time. She appears well-developed and well-nourished. No distress.  HENT:  Head: Normocephalic and atraumatic.  Right Ear: Tympanic membrane and ear canal normal.  Left Ear: Tympanic membrane and ear canal normal.  Nose: Nose normal.  Mouth/Throat: Oropharynx is clear and moist and mucous membranes are normal.  Eyes: Pupils are equal, round, and reactive to light. Conjunctivae are normal.  Neck: Normal range of motion. Neck supple. No thyromegaly present.  Cardiovascular: Normal rate, regular rhythm, normal heart sounds and intact distal pulses.  No murmur heard. Pulmonary/Chest: Effort normal and breath sounds normal. She has no wheezes. She has no rhonchi. Right breast exhibits no mass, no skin change and no tenderness. Left breast exhibits no mass, no skin change and no tenderness.  Abdominal: Soft. Bowel sounds are normal. She exhibits no distension and no mass. There is no abdominal tenderness.  Musculoskeletal: Normal range of motion.        General: No edema.  Lymphadenopathy:    She has no cervical adenopathy.  Neurological: She is alert and oriented to person, place, and time. She exhibits normal muscle tone. Coordination normal.  Skin: Skin is warm and dry.  Psychiatric: She has a normal mood and affect. Her behavior is normal.     A/P:  1. Annual physical exam - UTD on immunizations - UTD on dental exam, due for vision exam - PAP UTD - due in 09/2019 - cont with healthy diet and regular exercise - ALT; Future - AST; Future - Basic metabolic panel; Future - Lipid panel; Future - HIV screening; Future - next CPE in 1 year

## 2018-10-29 MED FILL — SPIRONOLACTONE 25 MG TABS: 25 | 90 days supply | Qty: 90 | Fill #2

## 2018-10-29 MED FILL — buPROPion HCL ER (XL) 150 M: 150 | 90 days supply | Qty: 90 | Fill #2

## 2018-10-29 MED FILL — DROSPIR-ETH ESTRA 3/.02 MG: 3-0.02 | 84 days supply | Qty: 84 | Fill #2

## 2018-10-31 ENCOUNTER — Encounter: Payer: Self-pay | Admitting: Family Medicine

## 2018-10-31 DIAGNOSIS — Z0100 Encounter for examination of eyes and vision without abnormal findings: Secondary | ICD-10-CM

## 2018-11-13 ENCOUNTER — Encounter: Payer: Self-pay | Admitting: Family Medicine

## 2018-11-13 DIAGNOSIS — Z3201 Encounter for pregnancy test, result positive: Secondary | ICD-10-CM

## 2018-11-26 ENCOUNTER — Ambulatory Visit: Payer: No Typology Code available for payment source

## 2018-12-03 ENCOUNTER — Ambulatory Visit (INDEPENDENT_AMBULATORY_CARE_PROVIDER_SITE_OTHER): Payer: No Typology Code available for payment source | Admitting: *Deleted

## 2018-12-03 ENCOUNTER — Encounter: Payer: Self-pay | Admitting: *Deleted

## 2018-12-03 ENCOUNTER — Other Ambulatory Visit: Payer: Self-pay

## 2018-12-03 VITALS — BP 121/75 | HR 116 | Temp 98.3°F | Ht 65.0 in | Wt 153.2 lb

## 2018-12-03 DIAGNOSIS — Z3200 Encounter for pregnancy test, result unknown: Secondary | ICD-10-CM

## 2018-12-03 DIAGNOSIS — Z3201 Encounter for pregnancy test, result positive: Secondary | ICD-10-CM

## 2018-12-03 LAB — POCT URINE PREGNANCY: Preg Test, Ur: POSITIVE — AB

## 2018-12-03 NOTE — Progress Notes (Signed)
   Hannah Duncan presents today for UPT. She has no unusual complaints. LMP: 10/17/2018    OBJECTIVE: Appears well, in no apparent distress.  OB History    Gravida  1   Para      Term      Preterm      AB      Living        SAB      TAB      Ectopic      Multiple      Live Births             Home UPT Result: Positive   In-Office UPT result: Positive I have reviewed the patient's medical, obstetrical and medications.   ASSESSMENT: Positive pregnancy test  PLAN Prenatal care to be completed at: [facility].   Derl Barrow, RN

## 2018-12-03 NOTE — Progress Notes (Signed)
    Ms. Hodder presents today for UPT. She has no unusual complaints. LMP: 10/17/2018    OBJECTIVE: Appears well, in no apparent distress.  OB History    Gravida  1   Para      Term      Preterm      AB      Living        SAB      TAB      Ectopic      Multiple      Live Births             Home UPT Result: Positive In-Office UPT result: Positive I have reviewed the patient's medical and medications.   ASSESSMENT: Positive pregnancy test  PLAN Prenatal care to be completed at: [facility] Loletha Carrow, RN

## 2018-12-27 ENCOUNTER — Encounter: Payer: Self-pay | Admitting: *Deleted

## 2018-12-27 ENCOUNTER — Other Ambulatory Visit: Payer: Self-pay

## 2018-12-27 ENCOUNTER — Telehealth (INDEPENDENT_AMBULATORY_CARE_PROVIDER_SITE_OTHER): Payer: No Typology Code available for payment source | Admitting: *Deleted

## 2018-12-27 DIAGNOSIS — Z3490 Encounter for supervision of normal pregnancy, unspecified, unspecified trimester: Secondary | ICD-10-CM

## 2018-12-27 NOTE — Progress Notes (Signed)
I connected with  Hannah Duncan on 12/27/18 at  9:30 AM EDT by telephone and verified that I am speaking with the correct person using two identifiers.   I discussed the limitations, risks, security and privacy concerns of performing an evaluation and management service by telephone and the availability of in person appointments. I also discussed with the patient that there may be a patient responsible charge related to this service. The patient expressed understanding and agreed to proceed. Explained I am completing her New OB Intake today. We discussed Her EDD and that it is based on  sure LMP . I reviewed her allergies, meds, OB History, Medical /Surgical history, and appropriate screenings. She was on ocp's until she found out she was pregnant. She  was on wellbutrin until she found out she was pregnant  And stopped. I explained we have Jamie, our [facility] in our office and she can see her at anytime. She also was changing cat litter until she found out she was pregnant. I explained we will send her Babyscripts app-she already had babyscripts through [facility] insurance; but deleted it and readded . From email sent from our office.  She confirms she has a blood pressure cuff and knows how to use it ( she is a Therapist, sports). Explained  then we will have her take her blood pressure weekly and enter into the app. Explained she will have some visits in office and some virtually. She already has Community education officer. Reviewed appointment date/ time with her , our location and to wear mask, no visitors. Explained she will have exam, ob bloodwork, hemoglobin a1C, cbg , genetic testing if desired, pap if needed.I scheduled an [place] at 19 weeks and gave her the appointment. She voices understanding.  Linda,RN 12/27/2018  9:31 AM

## 2019-01-17 ENCOUNTER — Ambulatory Visit (INDEPENDENT_AMBULATORY_CARE_PROVIDER_SITE_OTHER): Payer: No Typology Code available for payment source | Admitting: Student

## 2019-01-17 ENCOUNTER — Other Ambulatory Visit: Payer: Self-pay

## 2019-01-17 DIAGNOSIS — Z3491 Encounter for supervision of normal pregnancy, unspecified, first trimester: Secondary | ICD-10-CM

## 2019-01-17 DIAGNOSIS — Z113 Encounter for screening for infections with a predominantly sexual mode of transmission: Secondary | ICD-10-CM

## 2019-01-17 DIAGNOSIS — Z3A13 13 weeks gestation of pregnancy: Secondary | ICD-10-CM

## 2019-01-17 DIAGNOSIS — Z3490 Encounter for supervision of normal pregnancy, unspecified, unspecified trimester: Secondary | ICD-10-CM

## 2019-01-17 LAB — POCT URINALYSIS DIP (DEVICE)
Bilirubin Urine: NEGATIVE
Glucose, UA: NEGATIVE mg/dL
Hgb urine dipstick: NEGATIVE
Ketones, ur: NEGATIVE mg/dL
Leukocytes,Ua: NEGATIVE
Nitrite: NEGATIVE
Protein, ur: NEGATIVE mg/dL
Specific Gravity, Urine: 1.025 (ref 1.005–1.030)
Urobilinogen, UA: 0.2 mg/dL (ref 0.0–1.0)
pH: 7.5 (ref 5.0–8.0)

## 2019-01-17 NOTE — Patient Instructions (Addendum)
Eating Plan for Pregnant Women While you are pregnant, your body requires additional nutrition to help support your growing baby. You also have a higher need for some vitamins and minerals, such as folic acid, calcium, iron, and vitamin D. Eating a healthy, well-balanced diet is very important for your health and your baby's health. Your need for extra calories varies for the three 53-monthsegments of your pregnancy (trimesters). For most women, it is recommended to consume:  150 extra calories a day during the first trimester.  300 extra calories a day during the second trimester.  300 extra calories a day during the third trimester. What are tips for following this plan?   Do not try to lose weight or go on a diet during pregnancy.  Limit your overall intake of foods that have "empty calories." These are foods that have little nutritional value, such as sweets, desserts, candies, and sugar-sweetened beverages.  Eat a variety of foods (especially fruits and vegetables) to get a full range of vitamins and minerals.  Take a prenatal vitamin to help meet your additional vitamin and mineral needs during pregnancy, specifically for folic acid, iron, calcium, and vitamin D.  Remember to stay active. Ask your health care provider what types of exercise and activities are safe for you.  Practice good food safety and cleanliness. Wash your hands before you eat and after you prepare raw meat. Wash all fruits and vegetables well before peeling or eating. Taking these actions can help to prevent food-borne illnesses that can be very dangerous to your baby, such as listeriosis. Ask your health care provider for more information about listeriosis. What does 150 extra calories look like? Healthy options that provide 150 extra calories each day could be any of the following:  6-8 oz (170-230 g) of plain low-fat yogurt with  cup of berries.  1 apple with 2 teaspoons (11 g) of peanut butter.  Cut-up  vegetables with  cup (60 g) of hummus.  8 oz (230 mL) or 1 cup of low-fat chocolate milk.  1 stick of string cheese with 1 medium orange.  1 peanut butter and jelly sandwich that is made with one slice of whole-wheat bread and 1 tsp (5 g) of peanut butter. For 300 extra calories, you could eat two of those healthy options each day. What is a healthy amount of weight to gain? The right amount of weight gain for you is based on your BMI before you became pregnant. If your BMI:  Was less than 18 (underweight), you should gain 28-40 lb (13-18 kg).  Was 18-24.9 (normal), you should gain 25-35 lb (11-16 kg).  Was 25-29.9 (overweight), you should gain 15-25 lb (7-11 kg).  Was 30 or greater (obese), you should gain 11-20 lb (5-9 kg). What if I am having twins or multiples? Generally, if you are carrying twins or multiples:  You may need to eat 300-600 extra calories a day.  The recommended range for total weight gain is 25-54 lb (11-25 kg), depending on your BMI before pregnancy.  Talk with your health care provider to find out about nutritional needs, weight gain, and exercise that is right for you. What foods can I eat?  Grains All grains. Choose whole grains, such as whole-wheat bread, oatmeal, or brown rice. Vegetables All vegetables. Eat a variety of colors and types of vegetables. Remember to wash your vegetables well before peeling or eating. Fruits All fruits. Eat a variety of colors and types of fruit. Remember to wash  your fruits well before peeling or eating. Meats and other protein foods Lean meats, including chicken, [place], fish, and lean cuts of beef, veal, or pork. If you eat fish or seafood, choose options that are higher in omega-3 fatty acids and lower in mercury, such as salmon, herring, mussels, trout, sardines, pollock, shrimp, crab, and lobster. Tofu. Tempeh. Beans. Eggs. Peanut butter and other nut butters. Make sure that all meats, poultry, and eggs are cooked to  food-safe temperatures or "well-done." Two or more servings of fish are recommended each week in order to get the most benefits from omega-3 fatty acids that are found in seafood. Choose fish that are lower in mercury. You can find more information online:  GuamGaming.ch Dairy Pasteurized milk and milk alternatives (such as almond milk). Pasteurized yogurt and pasteurized cheese. Cottage cheese. Sour cream. Beverages Water. Juices that contain 100% fruit juice or vegetable juice. Caffeine-free teas and decaffeinated coffee. Drinks that contain caffeine are okay to drink, but it is better to avoid caffeine. Keep your total caffeine intake to less than 200 mg each day (which is 12 oz or 355 mL of coffee, tea, or soda) or the limit as told by your health care provider. Fats and oils Fats and oils are okay to include in moderation. Sweets and desserts Sweets and desserts are okay to include in moderation. Seasoning and other foods All pasteurized condiments. The items listed above may not be a complete list of recommended foods and beverages. Contact your dietitian for more options. The items listed above may not be a complete list of foods and beverages [you/your child] can eat. Contact a dietitian for more information. What foods are not recommended? Vegetables Raw (unpasteurized) vegetable juices. Fruits Unpasteurized fruit juices. Meats and other protein foods Lunch meats, bologna, hot dogs, or other deli meats. (If you must eat those meats, reheat them until they are steaming hot.) Refrigerated pat, meat spreads from a meat counter, smoked seafood that is found in the refrigerated section of a store. Raw or undercooked meats, poultry, and eggs. Raw fish, such as sushi or sashimi. Fish that have high mercury content, such as tilefish, shark, swordfish, and king mackerel. To learn more about mercury in fish, talk with your health care provider or look for online resources, such  as:  GuamGaming.ch Dairy Raw (unpasteurized) milk and any foods that have raw milk in them. Soft cheeses, such as feta, queso blanco, queso fresco, Brie, Camembert cheeses, blue-veined cheeses, and Panela cheese (unless it is made with pasteurized milk, which must be stated on the label). Beverages Alcohol. Sugar-sweetened beverages, such as sodas, teas, or energy drinks. Seasoning and other foods Homemade fermented foods and drinks, such as pickles, sauerkraut, or kombucha drinks. (Store-bought pasteurized versions of these are okay.) Salads that are made in a store or deli, such as ham salad, chicken salad, egg salad, tuna salad, and seafood salad. The items listed above may not be a complete list of foods and beverages to avoid. Contact your dietitian for more information. The items listed above may not be a complete list of foods and beverages [you/your child] should avoid. Contact a dietitian for more information. Where to find more information To calculate the number of calories you need based on your height, weight, and activity level, you can use an online calculator such as:  MobileTransition.ch To calculate how much weight you should gain during pregnancy, you can use an online pregnancy weight gain calculator such as:  StreamingFood.com.cy Summary  While you  are pregnant, your body requires additional nutrition to help support your growing baby.  Eat a variety of foods, especially fruits and vegetables to get a full range of vitamins and minerals.  Practice good food safety and cleanliness. Wash your hands before you eat and after you prepare raw meat. Wash all fruits and vegetables well before peeling or eating. Taking these actions can help to prevent food-borne illnesses, such as listeriosis, that can be very dangerous to your baby.  Do not eat raw meat or fish. Do not eat fish that have high mercury content, such as tilefish,  shark, swordfish, and king mackerel. Do not eat unpasteurized (raw) dairy.  Take a prenatal vitamin to help meet your additional vitamin and mineral needs during pregnancy, specifically for folic acid, iron, calcium, and vitamin D. This information is not intended to replace advice given to you by your health care provider. Make sure you discuss any questions you have with your health care provider. Document Released: 12/13/2013 Document Revised: 06/21/2018 Document Reviewed: 11/25/2016 Elsevier Patient Education  [place] Medications in Pregnancy   Acne: Benzoyl Peroxide Salicylic Acid  Backache/Headache: Tylenol: 2 regular strength every 4 hours OR              2 Extra strength every 6 hours  Colds/Coughs/Allergies: Benadryl (alcohol free) 25 mg every 6 hours as needed Breath right strips Claritin Cepacol throat lozenges Chloraseptic throat spray Cold-Eeze- up to three times per day Cough drops, alcohol free Flonase (by prescription only) Guaifenesin Mucinex Robitussin DM (plain only, alcohol free) Saline nasal spray/drops Sudafed (pseudoephedrine) & Actifed ** use only after [redacted] weeks gestation and if you do not have high blood pressure Tylenol Vicks Vaporub Zinc lozenges Zyrtec   Constipation: Colace Ducolax suppositories Fleet enema Glycerin suppositories Metamucil Milk of magnesia Miralax Senokot Smooth move tea  Diarrhea: Kaopectate Imodium A-D  *NO pepto Bismol  Hemorrhoids: Anusol Anusol HC Preparation H Tucks  Indigestion: Tums Maalox Mylanta Zantac  Pepcid  Insomnia: Benadryl (alcohol free) '25mg'$  every 6 hours as needed Tylenol PM Unisom, no Gelcaps  Leg Cramps: Tums MagGel  Nausea/Vomiting:  Bonine Dramamine Emetrol Ginger extract Sea bands Meclizine  Nausea medication to take during pregnancy:  Unisom (doxylamine succinate 25 mg tablets) Take one tablet daily at bedtime. If symptoms are not adequately  controlled, the dose can be increased to a maximum recommended dose of two tablets daily (1/2 tablet in the morning, 1/2 tablet mid-afternoon and one at bedtime). Vitamin B6 '100mg'$  tablets. Take one tablet twice a day (up to 200 mg per day).  Skin Rashes: Aveeno products Benadryl cream or '25mg'$  every 6 hours as needed Calamine Lotion 1% cortisone cream  Yeast infection: Gyne-lotrimin 7 Monistat 7  Gum/tooth pain: Anbesol  **If taking multiple medications, please check labels to avoid duplicating the same active ingredients **take medication as directed on the label ** Do not exceed 4000 mg of tylenol in 24 hours **Do not take medications that contain aspirin or ibuprofen    Childbirth Education Options: [facility] Department Classes:  Childbirth education classes can help you get ready for a positive parenting experience. You can also meet other expectant parents and get free stuff for your baby. Each class runs for five weeks on the same night and costs $45 for the mother-to-be and her support person. Medicaid covers the cost if you are eligible. Call [phone] to register. [facility] Childbirth Education:  [phone] or [phone] or [email or web address]  Baby &  Me Class: Discuss newborn & infant parenting and family adjustment issues with other new mothers in a relaxed environment. Each week brings a new speaker or baby-centered activity. We encourage new mothers to join [place] every Thursday at 11:00am. Babies birth until crawling. No registration or fee. Daddy [employer]: This course offers Dads-to-be the tools and knowledge needed to feel confident on their journey to becoming new fathers. Experienced dads, who have been trained as coaches, teach dads-to-be how to hold, comfort, diaper, swaddle and play with their infant while being able to support the new mom as well. A class for men taught by men. $25/dad Big Brother/Big Sister: Let your children share in  the joy of a new brother or sister in this special class designed just for them. Class includes discussion about how families care for babies: swaddling, holding, diapering, safety as well as how they can be helpful in their new role. This class is designed for children ages 58 to 32, but any age is welcome. Please register each child individually. $5/child  Mom Talk: This mom-led group offers support and connection to mothers as they journey through the adjustments and struggles of that sometimes overwhelming first year after the birth of a child. Tuesdays at 10:00am and Thursdays at 6:00pm. Babies welcome. No registration or fee. Breastfeeding Support Group: This group is a mother-to-mother support circle where moms have the opportunity to share their breastfeeding experiences. A Lactation Consultant is present for questions and concerns. Meets each Tuesday at 11:00am. No fee or registration. Breastfeeding Your Baby: Learn what to expect in the first days of breastfeeding your newborn.  This class will help you feel more confident with the skills needed to begin your breastfeeding experience. Many new mothers are concerned about breastfeeding after leaving the hospital. This class will also address the most common fears and challenges about breastfeeding during the first few weeks, months and beyond. (call for fee) Comfort Techniques and Tour: This 2 hour interactive class will provide you the opportunity to learn & practice hands-on techniques that can help relieve some of the discomfort of labor and encourage your baby to rotate toward the best position for birth. You and your partner will be able to try a variety of labor positions with birth balls and rebozos as well as practice breathing, relaxation, and visualization techniques. A tour of the [facility] is included with this class. $20 per registrant and support person Childbirth Class- Weekend Option: This class is a Weekend  version of our Birth & Baby series. It is designed for parents who have a difficult time fitting several weeks of classes into their schedule. It covers the care of your newborn and the basics of labor and childbirth. It also includes a [place] [facility] and lunch. The class is held two consecutive days: beginning on Friday evening from 6:30 - 8:30 p.m. and the next day, Saturday from 9 a.m. - 4 p.m. (call for fee) Doren Custard Class: Interested in a waterbirth?  This informational class will help you discover whether waterbirth is the right fit for you. Education about waterbirth itself, supplies you would need and how to assemble your support team is what you can expect from this class. Some obstetrical practices require this class in order to pursue a waterbirth. (Not all obstetrical practices offer waterbirth-check with your healthcare provider.) Register only the expectant mom, but you are encouraged to bring your partner to class! Required if planning waterbirth, no fee.  Infant/Child CPR: Parents, grandparents, babysitters, and friends learn Cardio-Pulmonary Resuscitation skills for infants and children. You will also learn how to treat both conscious and unconscious choking in infants and children. This Family & Friends program does not offer certification. Register each participant individually to ensure that enough mannequins are available. (Call for fee) Grandparent Love: Expecting a grandbaby? This class is for you! Learn about the latest infant care and safety recommendations and ways to support your own child as he or she transitions into the parenting role. Taught by Registered Nurses who are childbirth instructors, but most importantly...they are grandmothers too! $10/person. Childbirth Class- Natural Childbirth: This series of 5 weekly classes is for expectant parents who want to learn and practice natural methods of coping with the process of labor and childbirth.  Relaxation, breathing, massage, visualization, role of the partner, and helpful positioning are highlighted. Participants learn how to be confident in their body's ability to give birth. This class will empower and help parents make informed decisions about their own care. Includes discussion that will help new parents transition into the immediate postpartum period. [facility] is included. We suggest taking this class between 25-32 weeks, but it's only a recommendation. $75 per registrant and one support person or $30 Medicaid. Childbirth Class- 3 week Series: This option of 3 weekly classes helps you and your labor partner prepare for childbirth. Newborn care, labor & birth, cesarean birth, pain management, and comfort techniques are discussed and a [place] [facility] is included. The class meets at the same time, on the same day of the week for 3 consecutive weeks beginning with the starting date you choose. $60 for registrant and one support person.  Marvelous Multiples: Expecting twins, triplets, or more? This class covers the differences in labor, birth, parenting, and breastfeeding issues that face multiples parents. NICU tour is included. Led by a Certified Childbirth Educator who is the mother of twins. No fee. Caring for Baby: This class is for expectant and adoptive parents who want to learn and practice the most up-to-date newborn care for their babies. Focus is on birth through the first six weeks of life. Topics include feeding, bathing, diapering, crying, umbilical cord care, circumcision care and safe sleep. Parents learn to recognize symptoms of illness and when to call the pediatrician. Register only the mom-to-be and your partner or support person can plan to come with you! $10 per registrant and support person Childbirth Class- online option: This online class offers you the freedom to complete a Birth and Baby series in the  comfort of your own home. The flexibility of this option allows you to review sections at your own pace, at times convenient to you and your support people. It includes additional video information, animations, quizzes, and extended activities. Get organized with helpful eClass tools, checklists, and trackers. Once you register online for the class, you will receive an email within a few days to accept the invitation and begin the class when the time is right for you. The content will be available to you for 60 days. $60 for 60 days of online access for you and your support people.  Local Doulas: Natural Baby Doulas [email or web address] Tel: [phone] [email or web address] [phone] [email or web address] The Labor [facility]ell Halim  (also do waterbirth tub rental) [phone] [email or web address] Doula [phone] [email or web address] [facility] Rhythms  [phone] [email or web address] [employer] (PADA) [email or web address] [place] Birth [facility] Baby  [email or web address] Considering  Waterbirth? Guide for [facility] at [facility]  Why consider waterbirth?   Gentle birth for babies  Less pain medicine used in labor  May allow for passive descent/less pushing  May reduce perineal tears   More mobility and instinctive maternal position changes  Increased maternal relaxation  Reduced blood pressure in labor  Is waterbirth safe? What are the risks of infection, drowning or other complications?   Infection: o Very low risk (3.7 % for tub vs 4.8% for bed) o 7 in 8000 waterbirths with documented infection o Poorly cleaned equipment most common cause o Slightly lower group B strep transmission rate   Drowning o Maternal:  - Very low  risk   - Related to seizures or fainting o Newborn:  - Very low risk. No evidence of increased risk of respiratory problems in multiple large studies - Physiological protection from breathing under water - Avoid underwater birth if there are any fetal complications - Once babys head is out of the water, keep it out.   Birth complication o Some reports of cord trauma, but risk decreased by bringing baby to surface gradually o No evidence of increased risk of shoulder dystocia. Mothers can usually change positions faster in water than in a bed, possibly aiding the maneuvers to free the shoulder.   You must attend a Doren Custard class at [facility]  3rd Wednesday of every month from 7-9pm  [employer] by calling [phone] or online at [email or web address]  Bring [place] the certificate from the class to your prenatal appointment  Meet with a midwife at 36 weeks to see if you can still plan a waterbirth and to sign the consent.   Purchase or rent the following supplies:   Water Birth Pool (Birth Pool in a Box or [place] for instance)  (Tubs start ~$125)  Single-use disposable tub liner designed for your brand of tub  New garden hose labeled "lead-free", suitable for drinking water",  Electric drain pump to remove water (We recommend 792 gallon per hour or greater pump.)   Separate garden hose to remove the dirty water  Fish net  Bathing suit top (optional)  Long-handled mirror (optional)  Places to purchase or rent supplies  GotWebTools.is for tub purchases and supplies  Waterbirthsolutions.com for tub purchases and supplies  The Labor Ladies ([email or web address]) $275 for tub rental/set-up & take down/kit   [employer] (http://[email or web address]) Information regarding doulas (labor support) who provide pool rentals  Our practice has a Birth Pool in a Box tub at the hospital that you may borrow on a first-come-first-served  basis. It is your responsibility to to set up, clean and break down the tub. We cannot guarantee the availability of this tub in advance. You are responsible for bringing all accessories listed above. If you do not have all necessary supplies you cannot have a waterbirth.    Things that would prevent you from having a waterbirth:  Premature, <37wks  Previous cesarean birth  Presence of thick meconium-stained fluid  Multiple gestation (Twins, triplets, etc.)  Uncontrolled diabetes or gestational diabetes requiring medication  Hypertension requiring medication or diagnosis of pre-eclampsia  Heavy vaginal bleeding  Non-reassuring fetal heart rate  Active infection (MRSA, etc.). Group B Strep is NOT a contraindication for  waterbirth.  If your labor has to be induced and induction method requires continuous  monitoring of the baby's heart rate  Other risks/issues identified by your obstetrical provider  Please remember that birth is unpredictable. Under certain unforeseeable  circumstances your provider may advise against giving birth in the tub. These decisions will be made on a case-by-case basis and with the safety of you and your baby as our highest priority.

## 2019-01-17 NOTE — Progress Notes (Signed)
Subjective:   Hannah Duncan is a 23 y.o. G1P0 at [redacted]w[redacted]d by sure LMP being seen today for her first obstetrical visit. This was an unplanned pregnancy that occurred while she was taking OCPs. She is in a committed relationship with her spouse who is the father of the baby. Her obstetrical history is significant for this is her first pregnancy. Patient does intend to breast feed. Pregnancy history fully reviewed. She is an ICU nurse at [employer]. Does not routinely have COVID patients, but when they are on the unit she is not assigned to them.  Does not smoke, use illicit drugs, or drink alcohol. She was previously using Wellbutrin but discontinued with the pregnancy. Routinely sees a therapist for her depression.  She had a normal pap smear in 2018.  She received her flu vaccine this season through work.   Patient reports no complaints.  HISTORY: OB History  Gravida Para Term Preterm AB Living  1 0 0 0 0 0  SAB TAB Ectopic Multiple Live Births  0 0 0 0 0    # Outcome Date GA Lbr Len/2nd Weight Sex Delivery Anes PTL Lv  1 Current            Past Medical History:  Diagnosis Date  . Depression    Past Surgical History:  Procedure Laterality Date  . WISDOM TOOTH EXTRACTION  2012   Family History  Problem Relation Age of Onset  . Cancer Mother        melanoma  . Hypertension Mother   . Hyperlipidemia Father   . Cancer Maternal Grandmother        breast cancer  . Cancer Maternal Grandfather        skin cancer   Social History   Tobacco Use  . Smoking status: Never Smoker  . Smokeless tobacco: Never Used  Substance Use Topics  . Alcohol use: Not Currently    Frequency: Never    Comment: occasional  . Drug use: Never   No Known Allergies Current Outpatient Medications on File Prior to Visit  Medication Sig Dispense Refill  . Prenatal Vit-Fe Fumarate-FA (PRENATAL VITAMIN PO) Take 1 tablet by mouth daily.     No current facility-administered medications on  file prior to visit.     Exam   Vitals:   01/17/19 1021  BP: 108/64  Pulse: 85  Weight: 148 lb 11.2 oz (67.4 kg)   Fetal Heart Rate (bpm): 148  System: General: well-developed, well-nourished female in no acute distress   Skin: normal coloration and turgor, no rashes   Neurologic: oriented, normal, negative, normal mood   HEENT PERRLA, extraocular movement intact and sclera clear, anicteric   Mouth/Teeth mucous membranes moist, pharynx normal without lesions and dental hygiene good   Neck supple and no masses   Cardiovascular: regular rate and rhythm   Respiratory:  no respiratory distress, normal breath sounds   Abdomen: soft, non-tender; bowel sounds normal; no masses,  no organomegaly     Assessment:   Pregnancy: G1P0 Patient Active Problem List   Diagnosis Date Noted  . Supervision of low-risk pregnancy 12/27/2018  . Contusion of left knee 05/22/2017  . Moderate episode of recurrent major depressive disorder (HCC) 12/27/2016  . Pes cavus 06/07/2016  . Attention deficit hyperactivity disorder (ADHD) 07/21/2015     Plan:  1. Encounter for supervision of low-risk pregnancy, antepartum  - Culture, OB Urine - Genetic Screening - GC/Chlamydia probe amp ([place])not at [facility] -  Obstetric Panel, Including HIV - [place] MFM OB COMP + 14 WK; Future   Initial labs drawn. Flu vax - already received Continue prenatal vitamins. Genetic Screening discussed, NIPS: ordered. Ultrasound discussed; fetal anatomic survey: ordered. Problem list reviewed and updated. The nature of [facility] with multiple MDs and other Advanced Practice Providers was explained to patient; also emphasized that residents, students are part of our team. Routine obstetric precautions reviewed. Return in about 4 weeks (around 02/14/2019) for Routine OB virtual.   Jorje Guild 1:19 PM 01/17/19

## 2019-01-18 LAB — OBSTETRIC PANEL, INCLUDING HIV
Antibody Screen: NEGATIVE
Basophils Absolute: 0 10*3/uL (ref 0.0–0.2)
Basos: 0 %
EOS (ABSOLUTE): 0 10*3/uL (ref 0.0–0.4)
Eos: 0 %
HIV Screen 4th Generation wRfx: NONREACTIVE
Hematocrit: 34.4 % (ref 34.0–46.6)
Hemoglobin: 11 g/dL — ABNORMAL LOW (ref 11.1–15.9)
Hepatitis B Surface Ag: NEGATIVE
Immature Grans (Abs): 0 10*3/uL (ref 0.0–0.1)
Immature Granulocytes: 0 %
Lymphocytes Absolute: 1.6 10*3/uL (ref 0.7–3.1)
Lymphs: 23 %
MCH: 26.4 pg — ABNORMAL LOW (ref 26.6–33.0)
MCHC: 32 g/dL (ref 31.5–35.7)
MCV: 83 fL (ref 79–97)
Monocytes Absolute: 0.4 10*3/uL (ref 0.1–0.9)
Monocytes: 6 %
Neutrophils Absolute: 4.8 10*3/uL (ref 1.4–7.0)
Neutrophils: 71 %
Platelets: 214 10*3/uL (ref 150–450)
RBC: 4.16 x10E6/uL (ref 3.77–5.28)
RDW: 14.6 % (ref 11.7–15.4)
RPR Ser Ql: NONREACTIVE
Rh Factor: POSITIVE
Rubella Antibodies, IGG: 2.56 index (ref >0.99)
WBC: 6.9 10*3/uL (ref 3.4–10.8)

## 2019-01-19 LAB — CULTURE, OB URINE

## 2019-01-19 LAB — URINE CULTURE, OB REFLEX

## 2019-01-21 LAB — GC/CHLAMYDIA PROBE AMP (~~LOC~~) NOT AT ARMC
Chlamydia: NEGATIVE
Comment: NEGATIVE
Comment: NORMAL
Neisseria Gonorrhea: NEGATIVE

## 2019-01-25 ENCOUNTER — Other Ambulatory Visit: Payer: Self-pay

## 2019-01-31 ENCOUNTER — Encounter: Payer: Self-pay | Admitting: Family Medicine

## 2019-01-31 ENCOUNTER — Other Ambulatory Visit: Payer: Self-pay

## 2019-01-31 DIAGNOSIS — M545 Low back pain, unspecified: Secondary | ICD-10-CM

## 2019-02-04 ENCOUNTER — Encounter: Payer: Self-pay | Admitting: *Deleted

## 2019-02-14 ENCOUNTER — Telehealth (INDEPENDENT_AMBULATORY_CARE_PROVIDER_SITE_OTHER): Payer: No Typology Code available for payment source | Admitting: Obstetrics and Gynecology

## 2019-02-14 ENCOUNTER — Encounter: Payer: Self-pay | Admitting: *Deleted

## 2019-02-14 ENCOUNTER — Other Ambulatory Visit: Payer: Self-pay

## 2019-02-14 DIAGNOSIS — Z3490 Encounter for supervision of normal pregnancy, unspecified, unspecified trimester: Secondary | ICD-10-CM

## 2019-02-14 DIAGNOSIS — Z3492 Encounter for supervision of normal pregnancy, unspecified, second trimester: Secondary | ICD-10-CM

## 2019-02-14 DIAGNOSIS — Z3A17 17 weeks gestation of pregnancy: Secondary | ICD-10-CM

## 2019-02-14 NOTE — Progress Notes (Signed)
I connected with  Hannah Duncan on 02/14/19 at 10:35 AM EST by telephone and verified that I am speaking with the correct person using two identifiers.   I discussed the limitations, risks, security and privacy concerns of performing an evaluation and management service by telephone and the availability of in person appointments. I also discussed with the patient that there may be a patient responsible charge related to this service. The patient expressed understanding and agreed to proceed.  [place], CMA 02/14/2019  10:45 AM

## 2019-02-14 NOTE — Progress Notes (Signed)
   TELEHEALTH VIRTUAL OBSTETRICS VISIT ENCOUNTER NOTE  I connected with Hannah Duncan on 02/14/19 at 10:35 AM EST by telephone at home and verified that I am speaking with the correct person using two identifiers.   I discussed the limitations, risks, security and privacy concerns of performing an evaluation and management service by telephone and the availability of in person appointments. I also discussed with the patient that there may be a patient responsible charge related to this service. The patient expressed understanding and agreed to proceed.  Subjective:  Hannah Duncan is a 23 y.o. G1P0 at [redacted]w[redacted]d being followed for ongoing prenatal care.  She is currently monitored for the following issues for this low-risk pregnancy and has Pes cavus; Attention deficit hyperactivity disorder (ADHD); Moderate episode of recurrent major depressive disorder ([place]); Contusion of left knee; and Supervision of low-risk pregnancy on their problem list.  Patient reports no complaints. Reports fetal movement. Denies any contractions, bleeding or leaking of fluid.   The following portions of the patient's history were reviewed and updated as appropriate: allergies, current medications, past family history, past medical history, past social history, past surgical history and problem list.   Objective:   General:  Alert, oriented and cooperative.   Mental Status: Normal mood and affect perceived. Normal judgment and thought content.  Rest of physical exam deferred due to type of encounter  Assessment and Plan:  Pregnancy: G1P0 at [redacted]w[redacted]d 1. Encounter for supervision of low-risk pregnancy, antepartum  Doing well Feels safe at work with Covid numbers increasing. Has very supportive co-workers  In-person visit in 4 weeks for AFP BP 106/66 today.   Preterm labor symptoms and general obstetric precautions including but not limited to vaginal bleeding, contractions, leaking of fluid and fetal movement were reviewed  in detail with the patient.  I discussed the assessment and treatment plan with the patient. The patient was provided an opportunity to ask questions and all were answered. The patient agreed with the plan and demonstrated an understanding of the instructions. The patient was advised to call back or seek an in-person office evaluation/go to MAU at [facility] for any urgent or concerning symptoms. Please refer to After Visit Summary for other counseling recommendations.   I provided 10 minutes of non-face-to-face time during this encounter.  Return in about 4 weeks (around 03/14/2019) for In person before 22 weeks for AFP .  Future Appointments  Date Time Provider [place]  02/25/2019  2:30 PM WH-MFC [place], NP Center for [employer], [place]

## 2019-02-25 ENCOUNTER — Other Ambulatory Visit: Payer: No Typology Code available for payment source

## 2019-02-25 ENCOUNTER — Ambulatory Visit (HOSPITAL_COMMUNITY)
Admission: RE | Admit: 2019-02-25 | Discharge: 2019-02-25 | Disposition: A | Discharge status: Home / Self Care | Payer: No Typology Code available for payment source | Source: Ambulatory Visit | Attending: Obstetrics and Gynecology | Admitting: Obstetrics and Gynecology

## 2019-02-25 ENCOUNTER — Other Ambulatory Visit: Payer: Self-pay | Admitting: Obstetrics and Gynecology

## 2019-02-25 ENCOUNTER — Other Ambulatory Visit: Payer: Self-pay

## 2019-02-25 DIAGNOSIS — Z3490 Encounter for supervision of normal pregnancy, unspecified, unspecified trimester: Secondary | ICD-10-CM

## 2019-02-25 DIAGNOSIS — Z3A18 18 weeks gestation of pregnancy: Secondary | ICD-10-CM | POA: Diagnosis not present

## 2019-02-25 DIAGNOSIS — O359XX0 Maternal care for (suspected) fetal abnormality and damage, unspecified, not applicable or unspecified: Secondary | ICD-10-CM | POA: Diagnosis not present

## 2019-02-26 ENCOUNTER — Other Ambulatory Visit (HOSPITAL_COMMUNITY): Payer: Self-pay | Admitting: *Deleted

## 2019-02-26 DIAGNOSIS — Z362 Encounter for other antenatal screening follow-up: Secondary | ICD-10-CM

## 2019-02-27 LAB — AFP, SERUM, OPEN SPINA BIFIDA
AFP MoM: 1.5
AFP Value: 69.9 ng/mL
Gest. Age on Collection Date: 18.5 weeks
Maternal Age At EDD: 24.3 yr
OSBR Risk 1 IN: 2734
Test Results:: NEGATIVE
Weight: 148 [lb_av]

## 2019-03-05 ENCOUNTER — Encounter: Payer: Self-pay | Admitting: Family Medicine

## 2019-03-06 ENCOUNTER — Other Ambulatory Visit: Payer: Self-pay | Admitting: Family Medicine

## 2019-03-06 NOTE — Telephone Encounter (Signed)
Appears she is currently pregnant, I would recommend she discuss with her OB.

## 2019-03-14 ENCOUNTER — Other Ambulatory Visit: Payer: Self-pay

## 2019-03-14 ENCOUNTER — Ambulatory Visit (INDEPENDENT_AMBULATORY_CARE_PROVIDER_SITE_OTHER): Payer: No Typology Code available for payment source | Admitting: Obstetrics and Gynecology

## 2019-03-14 VITALS — BP 114/74 | HR 86 | Wt 151.2 lb

## 2019-03-14 DIAGNOSIS — Z3A21 21 weeks gestation of pregnancy: Secondary | ICD-10-CM

## 2019-03-14 DIAGNOSIS — O99342 Other mental disorders complicating pregnancy, second trimester: Secondary | ICD-10-CM

## 2019-03-14 DIAGNOSIS — Z3490 Encounter for supervision of normal pregnancy, unspecified, unspecified trimester: Secondary | ICD-10-CM

## 2019-03-14 DIAGNOSIS — F331 Major depressive disorder, recurrent, moderate: Secondary | ICD-10-CM

## 2019-03-14 MED ORDER — BUPROPION HCL ER (XL) 150 MG PO TB24: 150.0000 mg | ORAL_TABLET | Freq: Every day | ORAL | 1 refills | Status: DC

## 2019-03-14 MED FILL — BUPROPION HCL XL 150 MG TAB: 150 | 90 days supply | Qty: 90 | Fill #0

## 2019-03-14 NOTE — Progress Notes (Signed)
   PRENATAL VISIT NOTE  Subjective:  Hannah Duncan is a 23 y.o. G1P0 at [redacted]w[redacted]d being seen today for ongoing prenatal care.  She is currently monitored for the following issues for this low-risk pregnancy and has Pes cavus; Attention deficit hyperactivity disorder (ADHD); Moderate episode of recurrent major depressive disorder ([place]); Contusion of left knee; and Supervision of low-risk pregnancy on their problem list.  Patient reports no complaints.  Contractions: Not present. Vag. Bleeding: None.  Movement: Present. Denies leaking of fluid.   The following portions of the patient's history were reviewed and updated as appropriate: allergies, current medications, past family history, past medical history, past social history, past surgical history and problem list.   Objective:   Vitals:   03/14/19 0955  BP: 114/74  Pulse: 86  Weight: 151 lb 3.2 oz (68.6 kg)    Fetal Status: Fetal Heart Rate (bpm): 149 Fundal Height: 20 cm Movement: Present     General:  Alert, oriented and cooperative. Patient is in no acute distress.  Skin: Skin is warm and dry. No rash noted.   Cardiovascular: Normal heart rate noted  Respiratory: Normal respiratory effort, no problems with respiration noted  Abdomen: Soft, gravid, appropriate for gestational age.  Pain/Pressure: Present     Pelvic: Cervical exam deferred        Extremities: Normal range of motion.  Edema: None  Mental Status: Normal mood and affect. Normal behavior. Normal judgment and thought content.   Assessment and Plan:  Pregnancy: G1P0 at [redacted]w[redacted]d  1. Encounter for supervision of low-risk pregnancy, antepartum  Doing well.  2. Moderate episode of recurrent major depressive disorder (HCC)  Refill on Wellbutrin given     Preterm labor symptoms and general obstetric precautions including but not limited to vaginal bleeding, contractions, leaking of fluid and fetal movement were reviewed in detail with the patient. Please refer to After  Visit Summary for other counseling recommendations.   Return in about 4 weeks (around 04/11/2019) for virtual visit OK.  Future Appointments  Date Time Provider [place]  03/26/2019 10:30 AM WH-MFC [place] 1 WH-MFCUS MFC-US  04/11/2019 11:15 AM Karin Griffith, Artist Pais, NP [facility]    Noni Saupe, NP

## 2019-03-15 NOTE — L&D Delivery Note (Signed)
OB/GYN Faculty Practice Delivery Note  Hannah Duncan is a 24 y.o. G1P0 s/p NSVD at [redacted]w[redacted]d. She was admitted for SROM/SOL.   ROM: 20h 44m with clear fluid GBS Status: Negative Maximum Maternal Temperature: 98.9*F  Labor Progress: She was admitted after SROM. Pitocin was added for labor augmentation. She progressed to complete and delivered shortly after.  Delivery Date/Time: 07/12/19, 2310 Delivery: Called to room and patient was complete and pushing. Head delivered ROA. No nuchal cord present. Shoulder and body delivered in usual fashion. Infant with spontaneous cry, placed on mother's abdomen, dried and stimulated. Cord clamped x 2 after 1-minute delay, and cut by the nurse under my direct supervision. Cord blood drawn. Placenta delivered spontaneously with gentle cord traction. Fundus firm with massage and Pitocin. Labia, perineum, vagina, and cervix were inspected, and a right labial tear and 2nd degree laceration were noted- repaired with Monocryl in the usual fashion.   Due to blood loss greater than 860 mL, 1 g TXA IV and 1000 cytotec PR were given. Repeat CBC ordered for AM.  Placenta: 3 vessel cord, intact, to L&D Complications: excessive uterine bleeding Lacerations: right labial tear and 2nd degree laceration were noted- repaired with Monocryl in the usual fashion EBL: 860 mL Analgesia: epidural  Postpartum Planning [x]  message to sent to schedule follow-up  [x]  vaccines UTD  Infant: female  APGARs 9, 9  weight per medical record  , DO OB/GYN Fellow, Faculty Practice

## 2019-03-20 ENCOUNTER — Ambulatory Visit: Payer: No Typology Code available for payment source | Attending: Internal Medicine

## 2019-03-20 DIAGNOSIS — Z20822 Contact with and (suspected) exposure to covid-19: Secondary | ICD-10-CM

## 2019-03-21 LAB — NOVEL CORONAVIRUS, NAA: SARS-CoV-2, NAA: NOT DETECTED

## 2019-03-26 ENCOUNTER — Other Ambulatory Visit: Payer: Self-pay

## 2019-03-26 ENCOUNTER — Ambulatory Visit (HOSPITAL_COMMUNITY)
Admission: RE | Admit: 2019-03-26 | Discharge: 2019-03-26 | Disposition: A | Discharge status: Home / Self Care | Payer: No Typology Code available for payment source | Source: Ambulatory Visit | Attending: Obstetrics and Gynecology | Admitting: Obstetrics and Gynecology

## 2019-03-26 DIAGNOSIS — Z3A22 22 weeks gestation of pregnancy: Secondary | ICD-10-CM

## 2019-03-26 DIAGNOSIS — Z362 Encounter for other antenatal screening follow-up: Secondary | ICD-10-CM | POA: Diagnosis present

## 2019-03-26 DIAGNOSIS — O359XX0 Maternal care for (suspected) fetal abnormality and damage, unspecified, not applicable or unspecified: Secondary | ICD-10-CM | POA: Diagnosis not present

## 2019-04-11 ENCOUNTER — Other Ambulatory Visit: Payer: Self-pay

## 2019-04-11 ENCOUNTER — Telehealth (INDEPENDENT_AMBULATORY_CARE_PROVIDER_SITE_OTHER): Payer: No Typology Code available for payment source | Admitting: Obstetrics and Gynecology

## 2019-04-11 VITALS — BP 115/63 | HR 84

## 2019-04-11 DIAGNOSIS — Z3A25 25 weeks gestation of pregnancy: Secondary | ICD-10-CM

## 2019-04-11 DIAGNOSIS — Z3492 Encounter for supervision of normal pregnancy, unspecified, second trimester: Secondary | ICD-10-CM

## 2019-04-11 DIAGNOSIS — Z3490 Encounter for supervision of normal pregnancy, unspecified, unspecified trimester: Secondary | ICD-10-CM

## 2019-04-11 NOTE — Patient Instructions (Signed)

## 2019-04-11 NOTE — Progress Notes (Signed)
TELEHEALTH OBSTETRICS PRENATAL VIRTUAL VIDEO VISIT ENCOUNTER NOTE  Provider location: [facility] for [employer] at [place]   I connected with Greidys Altizer on 04/11/19 at 11:15 AM EST by WebEx Video Encounter at home and verified that I am speaking with the correct person using two identifiers.   I discussed the limitations, risks, security and privacy concerns of performing an evaluation and management service virtually and the availability of in person appointments. I also discussed with the patient that there may be a patient responsible charge related to this service. The patient expressed understanding and agreed to proceed. Subjective:  Hannah Duncan is a 24 y.o. G1P0 at [redacted]w[redacted]d being seen today for ongoing prenatal care.  She is currently monitored for the following issues for this low-risk pregnancy and has Pes cavus; Attention deficit hyperactivity disorder (ADHD); Moderate episode of recurrent major depressive disorder ([place]); Contusion of left knee; and Supervision of low-risk pregnancy on their problem list.  Patient reports no complaints.  Contractions: Not present. Vag. Bleeding: None.  Movement: Present. Denies any leaking of fluid.   The following portions of the patient's history were reviewed and updated as appropriate: allergies, current medications, past family history, past medical history, past social history, past surgical history and problem list.   Objective:   Vitals:   04/11/19 1108  BP: 115/63  Pulse: 84    Fetal Status:     Movement: Present     General:  Alert, oriented and cooperative. Patient is in no acute distress.  Respiratory: Normal respiratory effort, no problems with respiration noted  Mental Status: Normal mood and affect. Normal behavior. Normal judgment and thought content.  Rest of physical exam deferred due to type of encounter  Imaging: [place] MFM OB FOLLOW UP  Result Date:  03/26/2019 ----------------------------------------------------------------------  OBSTETRICS REPORT                       (Signed Final 03/26/2019 12:41 pm) ---------------------------------------------------------------------- Patient Info  ID #:       [id number]                          D.O.B.:  1995/10/29 (24 yrs)  Name:       Hannah Duncan                    Visit Date: 03/26/2019 10:35 am ---------------------------------------------------------------------- Performed By  Performed By:     Jacob Moores BS,       Secondary Phy.:   [place], RVT  Attending:        Tama High MD        Address:          [address]                                                             [address]  Referred By:      Artist Pais             Location:         [facility] NP                                 Fetal Care  Ref. Address:     [address] [place],                    [place] [address] ---------------------------------------------------------------------- Orders   #  Description                          Code         Ordered By   1  [place] MFM OB FOLLOW UP                  68127.51     Rosana Hoes  ----------------------------------------------------------------------   #  Order #                    Accession #                 Episode #   1  [id number]                  [id number]                  [id number]  ---------------------------------------------------------------------- Indications   [redacted] weeks gestation of pregnancy                Z3A.22   Fetal abnormality - other known or             O35.9XX0   suspected (EIFLV)   Encounter for antenatal screening for          Z36.3   malformations   Antenatal follow-up for nonvisualized fetal    Z36.2   anatomy  ---------------------------------------------------------------------- Fetal Evaluation  Num Of Fetuses:         1  Fetal Heart Rate(bpm):  158  Cardiac Activity:       Observed  Presentation:            Variable  Placenta:               Posterior  P. Cord Insertion:      Previously Visualized  Amniotic Fluid  AFI FV:      Within normal limits                              Largest Pocket(cm)                              4.22 ---------------------------------------------------------------------- Biometry  BPD:      52.5  mm     G. Age:  22w 0d         15  %    CI:        69.63   %    70 - 86                                                          FL/HC:  18.9   %    19.2 - 20.8  HC:      200.8  mm     G. Age:  22w 2d         15  %    HC/AC:      1.01        1.05 - 1.21  AC:       199   mm     G. Age:  24w 4d         89  %    FL/BPD:     72.2   %    71 - 87  FL:       37.9  mm     G. Age:  22w 1d         17  %    FL/AC:      19.0   %    20 - 24  HUM:      35.8  mm     G. Age:  22w 3d         34  %  CER:      25.5  mm     G. Age:  23w 4d         60  %  LV:        3.7  mm  CM:        5.3  mm  Est. FW:     581  gm      1 lb 4 oz     65  % ---------------------------------------------------------------------- OB History  Gravidity:    1 ---------------------------------------------------------------------- Gestational Age  LMP:           22w 6d        Date:  10/17/18                 EDD:   07/24/19  U/S Today:     22w 5d                                        EDD:   07/25/19  Best:          22w 6d     Det. By:  LMP  (10/17/18)          EDD:   07/24/19 ---------------------------------------------------------------------- Anatomy  Cranium:               Appears normal         LVOT:                   Appears normal  Cavum:                 Appears normal         Aortic Arch:            Appears normal  Ventricles:            Appears normal         Ductal Arch:            Appears normal  Choroid Plexus:        Appears normal         Diaphragm:              Appears normal  Cerebellum:            Appears normal  Stomach:                Appears normal, left                                                                         sided  Posterior Fossa:       Appears normal         Abdomen:                Previously seen  Nuchal Fold:           Previously seen        Abdominal Wall:         Appears nml (cord                                                                        insert, abd wall)  Face:                  Appears normal         Cord Vessels:           Appears normal (3                         (orbits and profile)                           vessel cord)  Lips:                  Appears normal         Kidneys:                Previously seen  Palate:                Not well visualized    Bladder:                Previously seen  Thoracic:              Previously seen        Spine:                  Limited views                                                                        appear normal  Heart:                 Echogenic focus        Upper Extremities:      Appears normal  in LV  RVOT:                  Appears normal         Lower Extremities:      Appears normal  Other:  Female gender previously seen. Heels and 5th digit visualized. Nasal          bone previously seen. Technically difficult due to fetal position. ---------------------------------------------------------------------- Cervix Uterus Adnexa  Cervix  Length:           3.38  cm.  Normal appearance by transabdominal scan.  Uterus  No abnormality visualized.  Left Ovary  Not visualized. No adnexal mass visualized.  Right Ovary  Not visualized. No adnexal mass visualized.  Cul De Sac  No free fluid seen.  Adnexa  No abnormality visualized. ---------------------------------------------------------------------- Impression  Patient returned for completion of fetal anatomy. Fetal growth  is appropriate for gestational age. Amniotic fluid is normal  and good fetal activity is seen. Fetal anatomical survey was  completed and appears normal. ---------------------------------------------------------------------- Recommendations  Follow-up scans  as clinically indicated. ----------------------------------------------------------------------                  Noralee Space, MD Electronically Signed Final Report   03/26/2019 12:41 pm ----------------------------------------------------------------------   Assessment and Plan:  Pregnancy: G1P0 at [redacted]w[redacted]d  1. Encounter for supervision of low-risk pregnancy, antepartum  Doing well BP good today In-person visit in 3-4 weeks for TDAP and 2 hour GTT.  Discussed covid vaccine- she is planning to get the vaccine PP.   Preterm labor symptoms and general obstetric precautions including but not limited to vaginal bleeding, contractions, leaking of fluid and fetal movement were reviewed in detail with the patient. I discussed the assessment and treatment plan with the patient. The patient was provided an opportunity to ask questions and all were answered. The patient agreed with the plan and demonstrated an understanding of the instructions. The patient was advised to call back or seek an in-person office evaluation/go to MAU at [facility] for any urgent or concerning symptoms. Please refer to After Visit Summary for other counseling recommendations.   I provided 10 minutes of face-to-face time during this encounter.  Return 3-4 weeks for in-person visit.Marland Kitchen  No future appointments.  Venia Carbon, NP Center for [employer], [facility]

## 2019-04-11 NOTE — Progress Notes (Signed)
I connected with  Farha Brentlinger on 04/11/19 at 11:15 AM EST by telephone and verified that I am speaking with the correct person using two identifiers.   I discussed the limitations, risks, security and privacy concerns of performing an evaluation and management service by telephone and the availability of in person appointments. I also discussed with the patient that there may be a patient responsible charge related to this service. The patient expressed understanding and agreed to proceed.  [name], CMA 04/11/2019  11:07 AM

## 2019-05-02 ENCOUNTER — Encounter: Payer: No Typology Code available for payment source | Admitting: Obstetrics and Gynecology

## 2019-05-06 ENCOUNTER — Telehealth: Payer: Self-pay | Admitting: Family Medicine

## 2019-05-06 NOTE — Telephone Encounter (Signed)
Called the patient to inform of the changes in appointment due to an add on with a 2 hour lab. The patient verbalized understanding.

## 2019-05-08 ENCOUNTER — Encounter: Payer: No Typology Code available for payment source | Admitting: Obstetrics & Gynecology

## 2019-05-08 ENCOUNTER — Other Ambulatory Visit: Payer: Self-pay | Admitting: *Deleted

## 2019-05-08 DIAGNOSIS — Z3490 Encounter for supervision of normal pregnancy, unspecified, unspecified trimester: Secondary | ICD-10-CM

## 2019-05-09 ENCOUNTER — Other Ambulatory Visit: Payer: Self-pay

## 2019-05-09 ENCOUNTER — Ambulatory Visit (INDEPENDENT_AMBULATORY_CARE_PROVIDER_SITE_OTHER): Payer: No Typology Code available for payment source | Admitting: Obstetrics and Gynecology

## 2019-05-09 ENCOUNTER — Other Ambulatory Visit: Payer: No Typology Code available for payment source

## 2019-05-09 DIAGNOSIS — Z3A29 29 weeks gestation of pregnancy: Secondary | ICD-10-CM

## 2019-05-09 DIAGNOSIS — Z3490 Encounter for supervision of normal pregnancy, unspecified, unspecified trimester: Secondary | ICD-10-CM

## 2019-05-09 DIAGNOSIS — Z23 Encounter for immunization: Secondary | ICD-10-CM | POA: Diagnosis not present

## 2019-05-09 DIAGNOSIS — Z3403 Encounter for supervision of normal first pregnancy, third trimester: Secondary | ICD-10-CM

## 2019-05-09 NOTE — Progress Notes (Signed)
   PRENATAL VISIT NOTE  Subjective:  Hannah Duncan is a 24 y.o. G1P0 at [redacted]w[redacted]d being seen today for ongoing prenatal care.  She is currently monitored for the following issues for this low-risk pregnancy and has Pes cavus; Attention deficit hyperactivity disorder (ADHD); Moderate episode of recurrent major depressive disorder (HCC); Contusion of left knee; and Supervision of low-risk pregnancy on their problem list.  Patient reports no complaints.  Contractions: Not present. Vag. Bleeding: None.  Movement: Present. Denies leaking of fluid.   The following portions of the patient's history were reviewed and updated as appropriate: allergies, current medications, past family history, past medical history, past social history, past surgical history and problem list.   Objective:   Vitals:   05/09/19 1123  BP: 125/73  Pulse: 97  Weight: 154 lb 8 oz (70.1 kg)    Fetal Status: Fetal Heart Rate (bpm): 152 Fundal Height: 28 cm Movement: Present     General:  Alert, oriented and cooperative. Patient is in no acute distress.  Skin: Skin is warm and dry. No rash noted.   Cardiovascular: Normal heart rate noted  Respiratory: Normal respiratory effort, no problems with respiration noted  Abdomen: Soft, gravid, appropriate for gestational age.  Pain/Pressure: Absent     Pelvic: Cervical exam deferred        Extremities: Normal range of motion.  Edema: None  Mental Status: Normal mood and affect. Normal behavior. Normal judgment and thought content.   Assessment and Plan:  Pregnancy: G1P0 at [redacted]w[redacted]d 1. Encounter for supervision of low-risk pregnancy, antepartum  2 hour today Virtual visit in 2 weeks.  BP great today. 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   Preterm labor symptoms and general obstetric precautions including but not limited to vaginal bleeding, contractions, leaking of fluid and fetal movement were reviewed in detail with the  patient. Please refer to After Visit Summary for other counseling recommendations.   Return in about 2 weeks (around 05/23/2019) for virtual is ok .  No future appointments.  Venia Carbon, NP

## 2019-05-09 NOTE — Patient Instructions (Signed)
Vaginal Bleeding During Pregnancy, Third Trimester ° °A small amount of bleeding (spotting) from the vagina is common during pregnancy. Sometimes the bleeding is normal and is not a problem, and sometimes it is a sign of something serious. Tell your doctor about any bleeding from your vagina right away. °Follow these instructions at home: °Activity °· Follow your doctor's instructions about how active you can be. Your doctor may recommend that you: °? Stay in bed and only get up to use the bathroom. °? Continue light activity. °· If needed, make plans for someone to help you with your normal activities. °· Ask your doctor if it is safe for you to drive. °· Do not lift anything that is heavier than 10 lb (4.5 kg) until your doctor says that this is safe. °· Do not have sex or orgasms until your doctor says that this is safe. °Medicines °· Take over-the-counter and prescription medicines only as told by your doctor. °· Do not take aspirin. It can cause bleeding. °General instructions °· Watch your condition for any changes. °· Write down: °? The number of pads you use each day. °? How often you change pads. °? How soaked (saturated) your pads are. °· Do not use tampons. °· Do not douche. °· If you pass any tissue from your vagina, save the tissue to show your doctor. °· Keep all follow-up visits as told by your doctor. This is important. °Contact a doctor if: °· You have vaginal bleeding at any time during pregnancy. °· You have cramps. °· You have a fever. °Get help right away if: °· You have very bad cramps. °· You have very bad pain in your back or belly (abdomen). °· You have a gush of fluid from your vagina. °· You pass large clots or a lot of tissue from your vagina. °· Your bleeding gets worse. °· You feel light-headed or weak. °· You pass out (faint). °· Your baby is moving less than usual, or not moving at all. °Summary °· Tell your doctor about any bleeding from your vagina right away. °· Follow instructions  from your doctor about how active you can be. You may need someone to help you with your normal activities. °This information is not intended to replace advice given to you by your health care provider. Make sure you discuss any questions you have with your health care provider. °Document Revised: 06/19/2018 Document Reviewed: 06/01/2016 °Elsevier Patient Education © 2020 [employer] ° °

## 2019-05-09 NOTE — Progress Notes (Signed)
Doing better.   

## 2019-05-10 LAB — CBC
Hematocrit: 29.4 % — ABNORMAL LOW (ref 34.0–46.6)
Hemoglobin: 9.9 g/dL — ABNORMAL LOW (ref 11.1–15.9)
MCH: 28.1 pg (ref 26.6–33.0)
MCHC: 33.7 g/dL (ref 31.5–35.7)
MCV: 84 fL (ref 79–97)
Platelets: 240 x10E3/uL (ref 150–450)
RBC: 3.52 x10E6/uL — ABNORMAL LOW (ref 3.77–5.28)
RDW: 12.4 % (ref 11.7–15.4)
WBC: 10.5 x10E3/uL (ref 3.4–10.8)

## 2019-05-10 LAB — GLUCOSE TOLERANCE, 2 HOURS W/ 1HR
Glucose, 1 hour: 89 mg/dL (ref 65–179)
Glucose, 2 hour: 89 mg/dL (ref 65–152)
Glucose, Fasting: 77 mg/dL (ref 65–91)

## 2019-05-10 LAB — HIV ANTIBODY (ROUTINE TESTING W REFLEX): HIV Screen 4th Generation wRfx: NONREACTIVE

## 2019-05-10 LAB — SYPHILIS: RPR W/REFLEX TO RPR TITER AND TREPONEMAL ANTIBODIES, TRADITIONAL SCREENING AND DIAGNOSIS ALGORITHM: RPR Ser Ql: NONREACTIVE

## 2019-05-13 ENCOUNTER — Encounter: Payer: Self-pay | Admitting: Obstetrics and Gynecology

## 2019-05-13 DIAGNOSIS — O99019 Anemia complicating pregnancy, unspecified trimester: Secondary | ICD-10-CM | POA: Insufficient documentation

## 2019-05-23 ENCOUNTER — Telehealth (INDEPENDENT_AMBULATORY_CARE_PROVIDER_SITE_OTHER): Payer: No Typology Code available for payment source | Admitting: Obstetrics and Gynecology

## 2019-05-23 VITALS — BP 117/63 | HR 79

## 2019-05-23 DIAGNOSIS — O99013 Anemia complicating pregnancy, third trimester: Secondary | ICD-10-CM

## 2019-05-23 DIAGNOSIS — Z3490 Encounter for supervision of normal pregnancy, unspecified, unspecified trimester: Secondary | ICD-10-CM

## 2019-05-23 DIAGNOSIS — Z3A31 31 weeks gestation of pregnancy: Secondary | ICD-10-CM

## 2019-05-23 NOTE — Progress Notes (Signed)
   TELEHEALTH OBSTETRICS PRENATAL VIRTUAL VIDEO VISIT ENCOUNTER NOTE  Provider location: [facility] [place]   I connected with Hannah Duncan on 05/23/19 at  2:55 PM EST by MyChart Video Encounter at home and verified that I am speaking with the correct person using two identifiers.   I discussed the limitations, risks, security and privacy concerns of performing an evaluation and management service virtually and the availability of in person appointments. I also discussed with the patient that there may be a patient responsible charge related to this service. The patient expressed understanding and agreed to proceed. Subjective:  Hannah Duncan is a 24 y.o. G1P0 at [redacted]w[redacted]d being seen today for ongoing prenatal care.  She is currently monitored for the following issues for this  low-risk pregnancy and has Pes cavus; Attention deficit hyperactivity disorder (ADHD); Moderate episode of recurrent major depressive disorder (HCC); Contusion of left knee; Supervision of low-risk pregnancy; and Anemia in pregnancy on their problem list.  Patient reports no complaints.  Contractions: Not present. Vag. Bleeding: None.  Movement: Present. Denies any leaking of fluid.   The following portions of the patient's history were reviewed and updated as appropriate: allergies, current medications, past family history, past medical history, past social history, past surgical history and problem list.   Objective:   Vitals:   05/23/19 1444  BP: 117/63  Pulse: 79    Fetal Status:     Movement: Present     General:  Alert, oriented and cooperative. Patient is in no acute distress.  Respiratory: Normal respiratory effort, no problems with respiration noted  Mental Status: Normal mood and affect. Normal behavior. Normal judgment and thought content.  Rest of physical exam deferred due to type of encounter  Imaging: No results found.  Assessment and Plan:  Pregnancy: G1P0 at [redacted]w[redacted]d 1. Encounter  for supervision of low-risk pregnancy, antepartum  - Doing well - BP normal - having some insomnia but otherwise feels good - All family members around her are vaccinated. She plans to receive her shot after baby is born.  - virtual visit in 2 weeks.    Preterm labor symptoms and general obstetric precautions including but not limited to vaginal bleeding, contractions, leaking of fluid and fetal movement were reviewed in detail with the patient. I discussed the assessment and treatment plan with the patient. The patient was provided an opportunity to ask questions and all were answered. The patient agreed with the plan and demonstrated an understanding of the instructions. The patient was advised to call back or seek an in-person office evaluation/go to [facility] at [facility] for any urgent or concerning symptoms. Please refer to After Visit Summary for other counseling recommendations.   I provided 10 minutes of face-to-face time during this encounter.  Return in about 2 weeks (around 06/06/2019) for virtual visit. Marland Kitchen  No future appointments.  Venia Carbon, NP [facility], [facility]

## 2019-05-23 NOTE — Progress Notes (Signed)
I connected with  Iracema Brindisi on 05/23/19 at  2:55 PM EST by telephone and verified that I am speaking with the correct person using two identifiers.   I discussed the limitations, risks, security and privacy concerns of performing an evaluation and management service by telephone and the availability of in person appointments. I also discussed with the patient that there may be a patient responsible charge related to this service. The patient expressed understanding and agreed to proceed.  Janene Madeira Tarique Loveall, CMA 05/23/2019  2:42 PM

## 2019-06-10 ENCOUNTER — Encounter: Payer: Self-pay | Admitting: Nurse Practitioner

## 2019-06-10 ENCOUNTER — Telehealth (INDEPENDENT_AMBULATORY_CARE_PROVIDER_SITE_OTHER): Payer: No Typology Code available for payment source | Admitting: Nurse Practitioner

## 2019-06-10 DIAGNOSIS — Z3493 Encounter for supervision of normal pregnancy, unspecified, third trimester: Secondary | ICD-10-CM

## 2019-06-10 NOTE — Progress Notes (Signed)
I connected with  Hannah Duncan on 06/10/19 at  4:15 PM EDT by telephone and verified that I am speaking with the correct person using two identifiers.   I discussed the limitations, risks, security and privacy concerns of performing an evaluation and management service by telephone and the availability of in person appointments. I also discussed with the patient that there may be a patient responsible charge related to this service. The patient expressed understanding and agreed to proceed.  [name], CMA 06/10/2019  3:54 PM

## 2019-06-10 NOTE — Progress Notes (Signed)
I connected with@ on 06/10/19 at  4:15 PM EDT by: Mychart and verified that I am speaking with the correct person using two identifiers.  Patient is located at home and provider is located at [facility].     The purpose of this virtual visit is to provide medical care while limiting exposure to the novel coronavirus. I discussed the limitations, risks, security and privacy concerns of performing an evaluation and management service by Mychart and the availability of in person appointments. I also discussed with the patient that there may be a patient responsible charge related to this service. By engaging in this virtual visit, you consent to the provision of healthcare.  Additionally, you authorize for your insurance to be billed for the services provided during this visit.  The patient expressed understanding and agreed to proceed.  The following staff members participated in the virtual visit:  Marykay Lex, CMA and Nolene Bernheim, NP    PRENATAL VISIT NOTE  Subjective:  Hannah Duncan is a 24 y.o. G1P0 at [redacted]w[redacted]d  for phone visit for ongoing prenatal care.  She is currently monitored for the following issues for this low-risk pregnancy and has Pes cavus; Attention deficit hyperactivity disorder (ADHD); Moderate episode of recurrent major depressive disorder (HCC); Contusion of left knee; Supervision of low-risk pregnancy; and Anemia in pregnancy on their problem list.  Patient reports no complaints.  Contractions: Not present. Vag. Bleeding: None.  Movement: Present. Denies leaking of fluid.   The following portions of the patient's history were reviewed and updated as appropriate: allergies, current medications, past family history, past medical history, past social history, past surgical history and problem list.   Objective:   Vitals:   06/10/19 1555  BP: 124/70  Pulse: 95   Self-Obtained  Fetal Status:     Movement: Present     Assessment and Plan:  Pregnancy: G1P0 at [redacted]w[redacted]d 1. Encounter  for supervision of low-risk pregnancy in third trimester Reviewed entering BP in babyscripts weekly.  Has been taking BP but does not always enter into Babyscripts.  Have all been normal. Plans covid shot soon - next week. Has taken childbirth class and is scheduled for breastfeeding class.  Preterm labor symptoms and general obstetric precautions including but not limited to vaginal bleeding, contractions, leaking of fluid and fetal movement were reviewed in detail with the patient.  Return in about 17 days (around 06/27/2019) for in person ROB for vaginal swabs.  No future appointments.   Time spent on virtual visit: 7 minutes  Currie Paris, NP

## 2019-06-28 ENCOUNTER — Other Ambulatory Visit (HOSPITAL_COMMUNITY)
Admission: RE | Admit: 2019-06-28 | Discharge: 2019-06-28 | Disposition: A | Discharge status: Home / Self Care | Payer: No Typology Code available for payment source | Source: Ambulatory Visit | Attending: Medical | Admitting: Medical

## 2019-06-28 ENCOUNTER — Other Ambulatory Visit: Payer: Self-pay

## 2019-06-28 ENCOUNTER — Ambulatory Visit (INDEPENDENT_AMBULATORY_CARE_PROVIDER_SITE_OTHER): Payer: No Typology Code available for payment source | Admitting: Medical

## 2019-06-28 VITALS — BP 117/77 | HR 84 | Wt 164.4 lb

## 2019-06-28 DIAGNOSIS — Z3493 Encounter for supervision of normal pregnancy, unspecified, third trimester: Secondary | ICD-10-CM

## 2019-06-28 DIAGNOSIS — D649 Anemia, unspecified: Secondary | ICD-10-CM

## 2019-06-28 DIAGNOSIS — O99343 Other mental disorders complicating pregnancy, third trimester: Secondary | ICD-10-CM

## 2019-06-28 DIAGNOSIS — Z3A36 36 weeks gestation of pregnancy: Secondary | ICD-10-CM

## 2019-06-28 DIAGNOSIS — O99013 Anemia complicating pregnancy, third trimester: Secondary | ICD-10-CM

## 2019-06-28 DIAGNOSIS — F331 Major depressive disorder, recurrent, moderate: Secondary | ICD-10-CM

## 2019-06-28 NOTE — Progress Notes (Signed)
   PRENATAL VISIT NOTE  Subjective:  Hannah Duncan is a 24 y.o. G1P0 at [redacted]w[redacted]d being seen today for ongoing prenatal care.  She is currently monitored for the following issues for this low-risk pregnancy and has Pes cavus; Attention deficit hyperactivity disorder (ADHD); Moderate episode of recurrent major depressive disorder (HCC); Contusion of left knee; Supervision of low-risk pregnancy; and Anemia in pregnancy on their problem list.  Patient reports pelvic pressure.  Contractions: Irritability. Vag. Bleeding: None.  Movement: Present. Denies leaking of fluid.   The following portions of the patient's history were reviewed and updated as appropriate: allergies, current medications, past family history, past medical history, past social history, past surgical history and problem list.   Objective:   Vitals:   06/28/19 0914  BP: 117/77  Pulse: 84  Weight: 164 lb 6.4 oz (74.6 kg)    Fetal Status: Fetal Heart Rate (bpm): 145 Fundal Height: 36 cm Movement: Present  Presentation: Vertex  General:  Alert, oriented and cooperative. Patient is in no acute distress.  Skin: Skin is warm and dry. No rash noted.   Cardiovascular: Normal heart rate noted  Respiratory: Normal respiratory effort, no problems with respiration noted  Abdomen: Soft, gravid, appropriate for gestational age.  Pain/Pressure: Present     Pelvic: Cervical exam performed in the presence of a chaperone Dilation: Closed Effacement (%): Thick Station: -3  Extremities: Normal range of motion.  Edema: None  Mental Status: Normal mood and affect. Normal behavior. Normal judgment and thought content.   Assessment and Plan:  Pregnancy: G1P0 at [redacted]w[redacted]d 1. Encounter for supervision of low-risk pregnancy in third trimester - Doing well, no complaints  - GC/Chlamydia probe amp ([place])not at [facility] - Culture, beta strep (group b only) - Planning to use [facility]   2. Anemia during pregnancy in third trimester  3. Moderate  episode of recurrent major depressive disorder (HCC) - Declined [facility] at recent visit   Preterm labor symptoms and general obstetric precautions including but not limited to vaginal bleeding, contractions, leaking of fluid and fetal movement were reviewed in detail with the patient. Please refer to After Visit Summary for other counseling recommendations.   No follow-ups on file.  No future appointments.  Vonzella Nipple, PA-C

## 2019-06-28 NOTE — Patient Instructions (Signed)
Braxton Hicks Contractions °Contractions of the uterus can occur throughout pregnancy, but they are not always a sign that you are in labor. You may have practice contractions called Braxton Hicks contractions. These false labor contractions are sometimes confused with true labor. °What are Braxton Hicks contractions? °Braxton Hicks contractions are tightening movements that occur in the muscles of the uterus before labor. Unlike true labor contractions, these contractions do not result in opening (dilation) and thinning of the cervix. Toward the end of pregnancy (32-34 weeks), Braxton Hicks contractions can happen more often and may become stronger. These contractions are sometimes difficult to tell apart from true labor because they can be very uncomfortable. You should not feel embarrassed if you go to the hospital with false labor. °Sometimes, the only way to tell if you are in true labor is for your health care provider to look for changes in the cervix. The health care provider will do a physical exam and may monitor your contractions. If you are not in true labor, the exam should show that your cervix is not dilating and your water has not broken. °If there are no other health problems associated with your pregnancy, it is completely safe for you to be sent home with false labor. You may continue to have Braxton Hicks contractions until you go into true labor. °How to tell the difference between true labor and false labor °True labor °· Contractions last 30-70 seconds. °· Contractions become very regular. °· Discomfort is usually felt in the top of the uterus, and it spreads to the lower abdomen and low back. °· Contractions do not go away with walking. °· Contractions usually become more intense and increase in frequency. °· The cervix dilates and gets thinner. °False labor °· Contractions are usually shorter and not as strong as true labor contractions. °· Contractions are usually irregular. °· Contractions  are often felt in the front of the lower abdomen and in the groin. °· Contractions may go away when you walk around or change positions while lying down. °· Contractions get weaker and are shorter-lasting as time goes on. °· The cervix usually does not dilate or become thin. °Follow these instructions at home: ° °· Take over-the-counter and prescription medicines only as told by your health care provider. °· Keep up with your usual exercises and follow other instructions from your health care provider. °· Eat and drink lightly if you think you are going into labor. °· If Braxton Hicks contractions are making you uncomfortable: °? Change your position from lying down or resting to walking, or change from walking to resting. °? Sit and rest in a tub of warm water. °? Drink enough fluid to keep your urine pale yellow. Dehydration may cause these contractions. °? Do slow and deep breathing several times an hour. °· Keep all follow-up prenatal visits as told by your health care provider. This is important. °Contact a health care provider if: °· You have a fever. °· You have continuous pain in your abdomen. °Get help right away if: °· Your contractions become stronger, more regular, and closer together. °· You have fluid leaking or gushing from your vagina. °· You pass blood-tinged mucus (bloody show). °· You have bleeding from your vagina. °· You have low back pain that you never had before. °· You feel your baby’s head pushing down and causing pelvic pressure. °· Your baby is not moving inside you as much as it used to. °Summary °· Contractions that occur before labor are   called Braxton Hicks contractions, false labor, or practice contractions. °· Braxton Hicks contractions are usually shorter, weaker, farther apart, and less regular than true labor contractions. True labor contractions usually become progressively stronger and regular, and they become more frequent. °· Manage discomfort from Braxton Hicks contractions  by changing position, resting in a warm bath, drinking plenty of water, or practicing deep breathing. °This information is not intended to replace advice given to you by your health care provider. Make sure you discuss any questions you have with your health care provider. °Document Revised: 02/10/2017 Document Reviewed: 07/14/2016 °Elsevier Patient Education © 2020 [employer] ° °

## 2019-07-01 LAB — GC/CHLAMYDIA PROBE AMP (~~LOC~~) NOT AT ARMC
Chlamydia: NEGATIVE
Comment: NEGATIVE
Comment: NORMAL
Neisseria Gonorrhea: NEGATIVE

## 2019-07-01 MED FILL — buPROPion HCL ER (XL) 150 M: 150 | 90 days supply | Qty: 90 | Fill #1

## 2019-07-02 LAB — CULTURE, BETA STREP (GROUP B ONLY): Strep Gp B Culture: NEGATIVE

## 2019-07-04 ENCOUNTER — Encounter: Payer: Self-pay | Admitting: Obstetrics and Gynecology

## 2019-07-04 ENCOUNTER — Ambulatory Visit (INDEPENDENT_AMBULATORY_CARE_PROVIDER_SITE_OTHER): Payer: No Typology Code available for payment source | Admitting: Obstetrics and Gynecology

## 2019-07-04 ENCOUNTER — Other Ambulatory Visit: Payer: Self-pay

## 2019-07-04 VITALS — BP 121/78 | HR 90 | Wt 166.0 lb

## 2019-07-04 DIAGNOSIS — O99013 Anemia complicating pregnancy, third trimester: Secondary | ICD-10-CM

## 2019-07-04 DIAGNOSIS — Z3A37 37 weeks gestation of pregnancy: Secondary | ICD-10-CM

## 2019-07-04 DIAGNOSIS — Z3493 Encounter for supervision of normal pregnancy, unspecified, third trimester: Secondary | ICD-10-CM

## 2019-07-04 DIAGNOSIS — F331 Major depressive disorder, recurrent, moderate: Secondary | ICD-10-CM

## 2019-07-04 DIAGNOSIS — D649 Anemia, unspecified: Secondary | ICD-10-CM

## 2019-07-04 DIAGNOSIS — O99343 Other mental disorders complicating pregnancy, third trimester: Secondary | ICD-10-CM

## 2019-07-04 NOTE — Patient Instructions (Signed)
Vaginal Delivery  Vaginal delivery means that you give birth by pushing your baby out of your birth canal (vagina). A team of health care providers will help you before, during, and after vaginal delivery. Birth experiences are unique for every woman and every pregnancy, and birth experiences vary depending on where you choose to give birth. What happens when I arrive at the birth center or hospital? Once you are in labor and have been admitted into the hospital or birth center, your health care provider may:  Review your pregnancy history and any concerns that you have.  Insert an IV into one of your veins. This may be used to give you fluids and medicines.  Check your blood pressure, pulse, temperature, and heart rate (vital signs).  Check whether your bag of water (amniotic sac) has broken (ruptured).  Talk with you about your birth plan and discuss pain control options. Monitoring Your health care provider may monitor your contractions (uterine monitoring) and your baby's heart rate (fetal monitoring). You may need to be monitored:  Often, but not continuously (intermittently).  All the time or for long periods at a time (continuously). Continuous monitoring may be needed if: ? You are taking certain medicines, such as medicine to relieve pain or make your contractions stronger. ? You have pregnancy or labor complications. Monitoring may be done by:  Placing a special stethoscope or a handheld monitoring device on your abdomen to check your baby's heartbeat and to check for contractions.  Placing monitors on your abdomen (external monitors) to record your baby's heartbeat and the frequency and length of contractions.  Placing monitors inside your uterus through your vagina (internal monitors) to record your baby's heartbeat and the frequency, length, and strength of your contractions. Depending on the type of monitor, it may remain in your uterus or on your baby's head until  birth.  Telemetry. This is a type of continuous monitoring that can be done with external or internal monitors. Instead of having to stay in bed, you are able to move around during telemetry. Physical exam Your health care provider may perform frequent physical exams. This may include:  Checking how and where your baby is positioned in your uterus.  Checking your cervix to determine: ? Whether it is thinning out (effacing). ? Whether it is opening up (dilating). What happens during labor and delivery?  Normal labor and delivery is divided into the following three stages: Stage 1  This is the longest stage of labor.  This stage can last for hours or days.  Throughout this stage, you will feel contractions. Contractions generally feel mild, infrequent, and irregular at first. They get stronger, more frequent (about every 2-3 minutes), and more regular as you move through this stage.  This stage ends when your cervix is completely dilated to 4 inches (10 cm) and completely effaced. Stage 2  This stage starts once your cervix is completely effaced and dilated and lasts until the delivery of your baby.  This stage may last from 20 minutes to 2 hours.  This is the stage where you will feel an urge to push your baby out of your vagina.  You may feel stretching and burning pain, especially when the widest part of your baby's head passes through the vaginal opening (crowning).  Once your baby is delivered, the umbilical cord will be clamped and cut. This usually occurs after waiting a period of 1-2 minutes after delivery.  Your baby will be placed on your bare chest (  skin-to-skin contact) in an upright position and covered with a warm blanket. Watch your baby for feeding cues, like rooting or sucking, and help the baby to your breast for his or her first feeding. Stage 3  This stage starts immediately after the birth of your baby and ends after you deliver the placenta.  This stage may  take anywhere from 5 to 30 minutes.  After your baby has been delivered, you will feel contractions as your body expels the placenta and your uterus contracts to control bleeding. What can I expect after labor and delivery?  After labor is over, you and your baby will be monitored closely until you are ready to go home to ensure that you are both healthy. Your health care team will teach you how to care for yourself and your baby.  You and your baby will stay in the same room (rooming in) during your hospital stay. This will encourage early bonding and successful breastfeeding.  You may continue to receive fluids and medicines through an IV.  Your uterus will be checked and massaged regularly (fundal massage).  You will have some soreness and pain in your abdomen, vagina, and the area of skin between your vaginal opening and your anus (perineum).  If an incision was made near your vagina (episiotomy) or if you had some vaginal tearing during delivery, cold compresses may be placed on your episiotomy or your tear. This helps to reduce pain and swelling.  You may be given a squirt bottle to use instead of wiping when you go to the bathroom. To use the squirt bottle, follow these steps: ? Before you urinate, fill the squirt bottle with warm water. Do not use hot water. ? After you urinate, while you are sitting on the toilet, use the squirt bottle to rinse the area around your urethra and vaginal opening. This rinses away any urine and blood. ? Fill the squirt bottle with clean water every time you use the bathroom.  It is normal to have vaginal bleeding after delivery. Wear a sanitary pad for vaginal bleeding and discharge. Summary  Vaginal delivery means that you will give birth by pushing your baby out of your birth canal (vagina).  Your health care provider may monitor your contractions (uterine monitoring) and your baby's heart rate (fetal monitoring).  Your health care provider may  perform a physical exam.  Normal labor and delivery is divided into three stages.  After labor is over, you and your baby will be monitored closely until you are ready to go home. This information is not intended to replace advice given to you by your health care provider. Make sure you discuss any questions you have with your health care provider. Document Revised: 04/04/2017 Document Reviewed: 04/04/2017 Elsevier Patient Education  [place] of Pregnancy The third trimester is from week 28 through week 40 (months 7 through 9). The third trimester is a time when the unborn baby (fetus) is growing rapidly. At the end of the ninth month, the fetus is about 20 inches in length and weighs 6-10 pounds. Body changes during your third trimester Your body will continue to go through many changes during pregnancy. The changes vary from woman to woman. During the third trimester:  Your weight will continue to increase. You can expect to gain 25-35 pounds (11-16 kg) by the end of the pregnancy.  You may begin to get stretch marks on your hips, abdomen, and breasts.  You may urinate more  often because the fetus is moving lower into your pelvis and pressing on your bladder.  You may develop or continue to have heartburn. This is caused by increased hormones that slow down muscles in the digestive tract.  You may develop or continue to have constipation because increased hormones slow digestion and cause the muscles that push waste through your intestines to relax.  You may develop hemorrhoids. These are swollen veins (varicose veins) in the rectum that can itch or be painful.  You may develop swollen, bulging veins (varicose veins) in your legs.  You may have increased body aches in the pelvis, back, or thighs. This is due to weight gain and increased hormones that are relaxing your joints.  You may have changes in your hair. These can include thickening of your hair, rapid  growth, and changes in texture. Some women also have hair loss during or after pregnancy, or hair that feels dry or thin. Your hair will most likely return to normal after your baby is born.  Your breasts will continue to grow and they will continue to become tender. A yellow fluid (colostrum) may leak from your breasts. This is the first milk you are producing for your baby.  Your belly button may stick out.  You may notice more swelling in your hands, face, or ankles.  You may have increased tingling or numbness in your hands, arms, and legs. The skin on your belly may also feel numb.  You may feel short of breath because of your expanding uterus.  You may have more problems sleeping. This can be caused by the size of your belly, increased need to urinate, and an increase in your body's metabolism.  You may notice the fetus "dropping," or moving lower in your abdomen (lightening).  You may have increased vaginal discharge.  You may notice your joints feel loose and you may have pain around your pelvic bone. What to expect at prenatal visits You will have prenatal exams every 2 weeks until week 36. Then you will have weekly prenatal exams. During a routine prenatal visit:  You will be weighed to make sure you and the baby are growing normally.  Your blood pressure will be taken.  Your abdomen will be measured to track your baby's growth.  The fetal heartbeat will be listened to.  Any test results from the previous visit will be discussed.  You may have a cervical check near your due date to see if your cervix has softened or thinned (effaced).  You will be tested for Group B streptococcus. This happens between 35 and 37 weeks. Your health care provider may ask you:  What your birth plan is.  How you are feeling.  If you are feeling the baby move.  If you have had any abnormal symptoms, such as leaking fluid, bleeding, severe headaches, or abdominal cramping.  If you are  using any tobacco products, including cigarettes, chewing tobacco, and electronic cigarettes.  If you have any questions. Other tests or screenings that may be performed during your third trimester include:  Blood tests that check for low iron levels (anemia).  Fetal testing to check the health, activity level, and growth of the fetus. Testing is done if you have certain medical conditions or if there are problems during the pregnancy.  Nonstress test (NST). This test checks the health of your baby to make sure there are no signs of problems, such as the baby not getting enough oxygen. During this test, a  belt is placed around your belly. The baby is made to move, and its heart rate is monitored during movement. What is false labor? False labor is a condition in which you feel small, irregular tightenings of the muscles in the womb (contractions) that usually go away with rest, changing position, or drinking water. These are called Braxton Hicks contractions. Contractions may last for hours, days, or even weeks before true labor sets in. If contractions come at regular intervals, become more frequent, increase in intensity, or become painful, you should see your health care provider. What are the signs of labor?  Abdominal cramps.  Regular contractions that start at 10 minutes apart and become stronger and more frequent with time.  Contractions that start on the top of the uterus and spread down to the lower abdomen and back.  Increased pelvic pressure and dull back pain.  A watery or bloody mucus discharge that comes from the vagina.  Leaking of amniotic fluid. This is also known as your "water breaking." It could be a slow trickle or a gush. Let your health care provider know if it has a color or strange odor. If you have any of these signs, call your health care provider right away, even if it is before your due date. Follow these instructions at home: Medicines  Follow your health  care provider's instructions regarding medicine use. Specific medicines may be either safe or unsafe to take during pregnancy.  Take a prenatal vitamin that contains at least 600 micrograms (mcg) of folic acid.  If you develop constipation, try taking a stool softener if your health care provider approves. Eating and drinking   Eat a balanced diet that includes fresh fruits and vegetables, whole grains, good sources of protein such as meat, eggs, or tofu, and low-fat dairy. Your health care provider will help you determine the amount of weight gain that is right for you.  Avoid raw meat and uncooked cheese. These carry germs that can cause birth defects in the baby.  If you have low calcium intake from food, talk to your health care provider about whether you should take a daily calcium supplement.  Eat four or five small meals rather than three large meals a day.  Limit foods that are high in fat and processed sugars, such as fried and sweet foods.  To prevent constipation: ? Drink enough fluid to keep your urine clear or pale yellow. ? Eat foods that are high in fiber, such as fresh fruits and vegetables, whole grains, and beans. Activity  Exercise only as directed by your health care provider. Most women can continue their usual exercise routine during pregnancy. Try to exercise for 30 minutes at least 5 days a week. Stop exercising if you experience uterine contractions.  Avoid heavy lifting.  Do not exercise in extreme heat or humidity, or at high altitudes.  Wear low-heel, comfortable shoes.  Practice good posture.  You may continue to have sex unless your health care provider tells you otherwise. Relieving pain and discomfort  Take frequent breaks and rest with your legs elevated if you have leg cramps or low back pain.  Take warm sitz baths to soothe any pain or discomfort caused by hemorrhoids. Use hemorrhoid cream if your health care provider approves.  Wear a good  support bra to prevent discomfort from breast tenderness.  If you develop varicose veins: ? Wear support pantyhose or compression stockings as told by your healthcare provider. ? Elevate your feet for 15 minutes,  3-4 times a day. Prenatal care  Write down your questions. Take them to your prenatal visits.  Keep all your prenatal visits as told by your health care provider. This is important. Safety  Wear your seat belt at all times when driving.  Make a list of emergency phone numbers, including numbers for family, friends, the hospital, and police and fire departments. General instructions  Avoid cat litter boxes and soil used by cats. These carry germs that can cause birth defects in the baby. If you have a cat, ask someone to clean the litter box for you.  Do not travel far distances unless it is absolutely necessary and only with the approval of your health care provider.  Do not use hot tubs, steam rooms, or saunas.  Do not drink alcohol.  Do not use any products that contain nicotine or tobacco, such as cigarettes and e-cigarettes. If you need help quitting, ask your health care provider.  Do not use any medicinal herbs or unprescribed drugs. These chemicals affect the formation and growth of the baby.  Do not douche or use tampons or scented sanitary pads.  Do not cross your legs for long periods of time.  To prepare for the arrival of your baby: ? Take prenatal classes to understand, practice, and ask questions about labor and delivery. ? Make a trial run to the hospital. ? Visit the hospital and tour the maternity area. ? Arrange for maternity or paternity leave through employers. ? Arrange for family and friends to take care of pets while you are in the hospital. ? Purchase a rear-facing car seat and make sure you know how to install it in your car. ? Pack your hospital bag. ? Prepare the baby's nursery. Make sure to remove all pillows and stuffed animals from the  baby's crib to prevent suffocation.  Visit your dentist if you have not gone during your pregnancy. Use a soft toothbrush to brush your teeth and be gentle when you floss. Contact a health care provider if:  You are unsure if you are in labor or if your water has broken.  You become dizzy.  You have mild pelvic cramps, pelvic pressure, or nagging pain in your abdominal area.  You have lower back pain.  You have persistent nausea, vomiting, or diarrhea.  You have an unusual or bad smelling vaginal discharge.  You have pain when you urinate. Get help right away if:  Your water breaks before 37 weeks.  You have regular contractions less than 5 minutes apart before 37 weeks.  You have a fever.  You are leaking fluid from your vagina.  You have spotting or bleeding from your vagina.  You have severe abdominal pain or cramping.  You have rapid weight loss or weight gain.  You have shortness of breath with chest pain.  You notice sudden or extreme swelling of your face, hands, ankles, feet, or legs.  Your baby makes fewer than 10 movements in 2 hours.  You have severe headaches that do not go away when you take medicine.  You have vision changes. Summary  The third trimester is from week 28 through week 40, months 7 through 9. The third trimester is a time when the unborn baby (fetus) is growing rapidly.  During the third trimester, your discomfort may increase as you and your baby continue to gain weight. You may have abdominal, leg, and back pain, sleeping problems, and an increased need to urinate.  During the third trimester  your breasts will keep growing and they will continue to become tender. A yellow fluid (colostrum) may leak from your breasts. This is the first milk you are producing for your baby.  False labor is a condition in which you feel small, irregular tightenings of the muscles in the womb (contractions) that eventually go away. These are called Braxton  Hicks contractions. Contractions may last for hours, days, or even weeks before true labor sets in.  Signs of labor can include: abdominal cramps; regular contractions that start at 10 minutes apart and become stronger and more frequent with time; watery or bloody mucus discharge that comes from the vagina; increased pelvic pressure and dull back pain; and leaking of amniotic fluid. This information is not intended to replace advice given to you by your health care provider. Make sure you discuss any questions you have with your health care provider. Document Revised: 06/21/2018 Document Reviewed: 04/05/2016 Elsevier Patient Education  2020 [employer].

## 2019-07-04 NOTE — Progress Notes (Signed)
Subjective:  Hannah Duncan is a 24 y.o. G1P0 at [redacted]w[redacted]d being seen today for ongoing prenatal care.  She is currently monitored for the following issues for this low-risk pregnancy and has Pes cavus; Attention deficit hyperactivity disorder (ADHD); Moderate episode of recurrent major depressive disorder (HCC); Supervision of low-risk pregnancy; and Anemia in pregnancy on their problem list.  Patient reports general discomforts of pregnancy.  Contractions: Not present. Vag. Bleeding: None.  Movement: Present. Denies leaking of fluid.   The following portions of the patient's history were reviewed and updated as appropriate: allergies, current medications, past family history, past medical history, past social history, past surgical history and problem list. Problem list updated.  Objective:   Vitals:   07/04/19 1444  BP: 121/78  Pulse: 90  Weight: 166 lb (75.3 kg)    Fetal Status: Fetal Heart Rate (bpm): 144   Movement: Present     General:  Alert, oriented and cooperative. Patient is in no acute distress.  Skin: Skin is warm and dry. No rash noted.   Cardiovascular: Normal heart rate noted  Respiratory: Normal respiratory effort, no problems with respiration noted  Abdomen: Soft, gravid, appropriate for gestational age. Pain/Pressure: Absent     Pelvic:  Cervical exam deferred        Extremities: Normal range of motion.  Edema: None  Mental Status: Normal mood and affect. Normal behavior. Normal judgment and thought content.   Urinalysis:      Assessment and Plan:  Pregnancy: G1P0 at [redacted]w[redacted]d  1. Encounter for supervision of low-risk pregnancy in third trimester Stable Labor precautions  2. Moderate episode of recurrent major depressive disorder (HCC) Stable on current meds  3. Anemia during pregnancy in third trimester Stable Continue with PNV and iron  Term labor symptoms and general obstetric precautions including but not limited to vaginal bleeding, contractions, leaking of  fluid and fetal movement were reviewed in detail with the patient. Please refer to After Visit Summary for other counseling recommendations.  Return in about 1 week (around 07/11/2019) for OB visit, virtual, any provider.   Hermina Staggers, MD

## 2019-07-09 ENCOUNTER — Telehealth (INDEPENDENT_AMBULATORY_CARE_PROVIDER_SITE_OTHER): Payer: No Typology Code available for payment source | Admitting: Nurse Practitioner

## 2019-07-09 ENCOUNTER — Encounter: Payer: Self-pay | Admitting: Nurse Practitioner

## 2019-07-09 DIAGNOSIS — Z3493 Encounter for supervision of normal pregnancy, unspecified, third trimester: Secondary | ICD-10-CM

## 2019-07-09 NOTE — Progress Notes (Signed)
I connected with@ on 07/09/19 at  1:15 PM EDT by: MyChart and verified that I am speaking with the correct person using two identifiers.  Patient is located at home and provider is located at [facility].     The purpose of this virtual visit is to provide medical care while limiting exposure to the novel coronavirus. I discussed the limitations, risks, security and privacy concerns of performing an evaluation and management service by MyChart and the availability of in person appointments. I also discussed with the patient that there may be a patient responsible charge related to this service. By engaging in this virtual visit, you consent to the provision of healthcare.  Additionally, you authorize for your insurance to be billed for the services provided during this visit.  The patient expressed understanding and agreed to proceed.  The following staff members participated in the virtual visit:  Marykay Lex, CMA and Nolene Bernheim, NP    PRENATAL VISIT NOTE  Subjective:  Hannah Duncan is a 24 y.o. G1P0 at [redacted]w[redacted]d  for phone visit for ongoing prenatal care.  She is currently monitored for the following issues for this low-risk pregnancy and has Pes cavus; Attention deficit hyperactivity disorder (ADHD); Moderate episode of recurrent major depressive disorder (HCC); Supervision of low-risk pregnancy; and Anemia in pregnancy on their problem list.  Patient reports occasional contractions.  Contractions: Irritability. Vag. Bleeding: None.  Movement: Present. Denies leaking of fluid.   The following portions of the patient's history were reviewed and updated as appropriate: allergies, current medications, past family history, past medical history, past social history, past surgical history and problem list.   Objective:   Vitals:   07/09/19 1314  BP: 107/68  Pulse: 75   Self-Obtained  Fetal Status:     Movement: Present     Assessment and Plan:  Pregnancy: G1P0 at [redacted]w[redacted]d 1. Encounter for  supervision of low-risk pregnancy in third trimester Baby is moving well. BPs reviewed in Babyscripts - normal Next visit in the office is in person for cervical exam and scheduling for induction if still not in labor.  Term labor symptoms and general obstetric precautions including but not limited to vaginal bleeding, contractions, leaking of fluid and fetal movement were reviewed in detail with the patient.  Return in about 1 week (around 07/16/2019) for in person ROB.  No future appointments.  At the time of the close of this chart.  Time spent on virtual visit: 7 minutes  Currie Paris, NP

## 2019-07-09 NOTE — Progress Notes (Signed)
I connected with  Hannah Duncan on 07/09/19 at  1:15 PM EDT by telephone and verified that I am speaking with the correct person using two identifiers.   I discussed the limitations, risks, security and privacy concerns of performing an evaluation and management service by telephone and the availability of in person appointments. I also discussed with the patient that there may be a patient responsible charge related to this service. The patient expressed understanding and agreed to proceed.  [name], CMA 07/09/2019  1:11 PM

## 2019-07-12 ENCOUNTER — Encounter (HOSPITAL_COMMUNITY): Payer: Self-pay | Admitting: Obstetrics and Gynecology

## 2019-07-12 ENCOUNTER — Inpatient Hospital Stay (HOSPITAL_COMMUNITY)
Admission: AD | Admit: 2019-07-12 | Discharge: 2019-07-14 | DRG: 806 | Disposition: A | Discharge status: Home / Self Care | Payer: No Typology Code available for payment source | Attending: Obstetrics and Gynecology | Admitting: Obstetrics and Gynecology

## 2019-07-12 ENCOUNTER — Inpatient Hospital Stay (HOSPITAL_COMMUNITY): Payer: No Typology Code available for payment source | Admitting: Anesthesiology

## 2019-07-12 ENCOUNTER — Other Ambulatory Visit: Payer: Self-pay

## 2019-07-12 DIAGNOSIS — O9902 Anemia complicating childbirth: Secondary | ICD-10-CM | POA: Diagnosis present

## 2019-07-12 DIAGNOSIS — F331 Major depressive disorder, recurrent, moderate: Secondary | ICD-10-CM | POA: Diagnosis present

## 2019-07-12 DIAGNOSIS — O42013 Preterm premature rupture of membranes, onset of labor within 24 hours of rupture, third trimester: Secondary | ICD-10-CM | POA: Diagnosis not present

## 2019-07-12 DIAGNOSIS — Z20822 Contact with and (suspected) exposure to covid-19: Secondary | ICD-10-CM | POA: Diagnosis present

## 2019-07-12 DIAGNOSIS — O4292 Full-term premature rupture of membranes, unspecified as to length of time between rupture and onset of labor: Principal | ICD-10-CM

## 2019-07-12 DIAGNOSIS — D649 Anemia, unspecified: Secondary | ICD-10-CM | POA: Diagnosis present

## 2019-07-12 DIAGNOSIS — Z3A38 38 weeks gestation of pregnancy: Secondary | ICD-10-CM

## 2019-07-12 DIAGNOSIS — O99344 Other mental disorders complicating childbirth: Secondary | ICD-10-CM | POA: Diagnosis present

## 2019-07-12 DIAGNOSIS — O26893 Other specified pregnancy related conditions, third trimester: Secondary | ICD-10-CM | POA: Diagnosis present

## 2019-07-12 LAB — CBC
HCT: 34 % — ABNORMAL LOW (ref 36.0–46.0)
Hemoglobin: 10.8 g/dL — ABNORMAL LOW (ref 12.0–15.0)
MCH: 26.9 pg (ref 26.0–34.0)
MCHC: 31.8 g/dL (ref 30.0–36.0)
MCV: 84.6 fL (ref 80.0–100.0)
Platelets: 228 10*3/uL (ref 150–400)
RBC: 4.02 MIL/uL (ref 3.87–5.11)
RDW: 13.9 % (ref 11.5–15.5)
WBC: 12 10*3/uL — ABNORMAL HIGH (ref 4.0–10.5)
nRBC: 0 % (ref 0.0–0.2)

## 2019-07-12 LAB — ABO/RH: ABO/RH(D): AB POS

## 2019-07-12 LAB — RESPIRATORY PANEL BY RT PCR (FLU A&B, COVID)
Influenza A by PCR: NEGATIVE
Influenza B by PCR: NEGATIVE
SARS Coronavirus 2 by RT PCR: NEGATIVE

## 2019-07-12 LAB — POCT FERN TEST

## 2019-07-12 LAB — RPR: RPR Ser Ql: NONREACTIVE

## 2019-07-12 LAB — TYPE AND SCREEN
ABO/RH(D): AB POS
Antibody Screen: NEGATIVE

## 2019-07-12 MED ORDER — PHENYLEPHRINE 40 MCG/ML (10ML) SYRINGE FOR IV PUSH (FOR BLOOD PRESSURE SUPPORT): 80.0000 ug | PREFILLED_SYRINGE | INTRAVENOUS | Status: DC | PRN

## 2019-07-12 MED ORDER — TERBUTALINE SULFATE 1 MG/ML IJ SOLN: 0.2500 mg | Freq: Once | INTRAMUSCULAR | Status: DC | PRN

## 2019-07-12 MED ORDER — LACTATED RINGERS IV SOLN
500.0000 mL | INTRAVENOUS | Status: DC | PRN
  Administered 2019-07-12: 500 mL via INTRAVENOUS
  Administered 2019-07-12: 1000 mL via INTRAVENOUS

## 2019-07-12 MED ORDER — DIPHENHYDRAMINE HCL 50 MG/ML IJ SOLN
12.5000 mg | INTRAMUSCULAR | Status: DC | PRN
  Administered 2019-07-12: 12.5 mg via INTRAVENOUS
  Filled 2019-07-12: qty 1

## 2019-07-12 MED ORDER — OXYCODONE-ACETAMINOPHEN 5-325 MG PO TABS: 2.0000 | ORAL_TABLET | ORAL | Status: DC | PRN

## 2019-07-12 MED ORDER — TRANEXAMIC ACID-NACL 1000-0.7 MG/100ML-% IV SOLN
INTRAVENOUS | Status: AC
  Administered 2019-07-12: 23:00:00 1000 mg via INTRAVENOUS
  Filled 2019-07-12: qty 100

## 2019-07-12 MED ORDER — EPHEDRINE 5 MG/ML INJ: 10.0000 mg | INTRAVENOUS | Status: DC | PRN

## 2019-07-12 MED ORDER — ONDANSETRON HCL 4 MG/2ML IJ SOLN
4.0000 mg | Freq: Four times a day (QID) | INTRAMUSCULAR | Status: DC | PRN
  Administered 2019-07-12: 4 mg via INTRAVENOUS
  Filled 2019-07-12: qty 2

## 2019-07-12 MED ORDER — LIDOCAINE HCL (PF) 1 % IJ SOLN
30.0000 mL | INTRAMUSCULAR | Status: AC | PRN
  Administered 2019-07-12 (×2): 6 mL via SUBCUTANEOUS

## 2019-07-12 MED ORDER — LACTATED RINGERS IV SOLN
500.0000 mL | Freq: Once | INTRAVENOUS | Status: AC
  Administered 2019-07-12: 500 mL via INTRAVENOUS

## 2019-07-12 MED ORDER — SODIUM CHLORIDE (PF) 0.9 % IJ SOLN
INTRAMUSCULAR | Status: DC | PRN
  Administered 2019-07-12: 12 mL/h via EPIDURAL

## 2019-07-12 MED ORDER — OXYCODONE-ACETAMINOPHEN 5-325 MG PO TABS: 1.0000 | ORAL_TABLET | ORAL | Status: DC | PRN

## 2019-07-12 MED ORDER — FENTANYL CITRATE (PF) 100 MCG/2ML IJ SOLN
100.0000 ug | INTRAMUSCULAR | Status: DC | PRN
  Administered 2019-07-12 (×2): 100 ug via INTRAVENOUS
  Filled 2019-07-12 (×2): qty 2

## 2019-07-12 MED ORDER — FERROUS SULFATE 325 (65 FE) MG PO TABS
324.0000 mg | ORAL_TABLET | Freq: Every day | ORAL | Status: DC
  Administered 2019-07-12: 324 mg via ORAL
  Filled 2019-07-12: qty 1

## 2019-07-12 MED ORDER — MISOPROSTOL 200 MCG PO TABS
ORAL_TABLET | ORAL | Status: AC
  Administered 2019-07-12: 1000 ug via RECTAL
  Filled 2019-07-12: qty 5

## 2019-07-12 MED ORDER — TRANEXAMIC ACID-NACL 1000-0.7 MG/100ML-% IV SOLN: 1000.0000 mg | Freq: Once | INTRAVENOUS | Status: AC

## 2019-07-12 MED ORDER — BUPROPION HCL ER (XL) 150 MG PO TB24
150.0000 mg | ORAL_TABLET | Freq: Every day | ORAL | Status: DC
  Administered 2019-07-12: 150 mg via ORAL
  Filled 2019-07-12: qty 1

## 2019-07-12 MED ORDER — OXYTOCIN 40 UNITS IN NORMAL SALINE INFUSION - SIMPLE MED
2.5000 [IU]/h | INTRAVENOUS | Status: DC
  Filled 2019-07-12: qty 1000

## 2019-07-12 MED ORDER — ACETAMINOPHEN 325 MG PO TABS: 650.0000 mg | ORAL_TABLET | ORAL | Status: DC | PRN

## 2019-07-12 MED ORDER — FLEET ENEMA 7-19 GM/118ML RE ENEM: 1.0000 | ENEMA | RECTAL | Status: DC | PRN

## 2019-07-12 MED ORDER — LACTATED RINGERS IV SOLN: INTRAVENOUS | Status: DC

## 2019-07-12 MED ORDER — SOD CITRATE-CITRIC ACID 500-334 MG/5ML PO SOLN: 30.0000 mL | ORAL | Status: DC | PRN

## 2019-07-12 MED ORDER — OXYTOCIN BOLUS FROM INFUSION
500.0000 mL | Freq: Once | INTRAVENOUS | Status: AC
  Administered 2019-07-12: 500 mL via INTRAVENOUS

## 2019-07-12 MED ORDER — FENTANYL-BUPIVACAINE-NACL 0.5-0.125-0.9 MG/250ML-% EP SOLN
12.0000 mL/h | EPIDURAL | Status: DC | PRN
  Filled 2019-07-12: qty 250

## 2019-07-12 MED ORDER — DOCUSATE SODIUM 100 MG PO CAPS: 100.0000 mg | ORAL_CAPSULE | Freq: Every day | ORAL | Status: DC

## 2019-07-12 MED ORDER — OXYTOCIN 40 UNITS IN NORMAL SALINE INFUSION - SIMPLE MED
1.0000 m[IU]/min | INTRAVENOUS | Status: DC
  Administered 2019-07-12: 2 m[IU]/min via INTRAVENOUS

## 2019-07-12 MED ORDER — MISOPROSTOL 200 MCG PO TABS: 1000.0000 ug | ORAL_TABLET | Freq: Once | ORAL | Status: AC

## 2019-07-12 NOTE — H&P (Addendum)
OBSTETRIC ADMISSION HISTORY AND PHYSICAL  Hannah Duncan is a 24 y.o. female G1P0 with IUP at [redacted]w[redacted]d by LMP presenting for SROM. She reports +FMs, No LOF, no VB, no blurry vision, headaches or peripheral edema, and RUQ pain.  She plans on breast feeding. She requests POPs for birth control. She received her prenatal care at [facility] [place]  Dating: By LMP --->  Estimated Date of Delivery: 07/24/19  Sono:    @[redacted]w[redacted]d , CWD, normal anatomy, variable presentation, 581g, 65% EFW   Prenatal History/Complications: Anemia in pregnancy, third trimester  Past Medical History: Past Medical History:  Diagnosis Date   Contusion of left knee 05/22/2017   Depression     Past Surgical History: Past Surgical History:  Procedure Laterality Date   WISDOM TOOTH EXTRACTION  2012    Obstetrical History: OB History     Gravida  1   Para      Term      Preterm      AB      Living  0      SAB      TAB      Ectopic      Multiple      Live Births              Social History Social History   Socioeconomic History   Marital status: Married    Spouse name: Not on file   Number of children: Not on file   Years of education: Not on file   Highest education level: Not on file  Occupational History   Not on file  Tobacco Use   Smoking status: Never Smoker   Smokeless tobacco: Never Used  Substance and Sexual Activity   Alcohol use: Not Currently    Comment: occasional   Drug use: Never   Sexual activity: Yes    Birth control/protection: None    Comment: stopped taking ocp 11/13/18  Other Topics Concern   Not on file  Social History Narrative   Not on file   Social Determinants of Health   Financial Resource Strain:    Difficulty of Paying Living Expenses:   Food Insecurity: No Food Insecurity   Worried About Running Out of Food in the Last Year: Never true   Ran Out of Food in the Last Year: Never true  Transportation Needs: No Transportation Needs   Lack of  Transportation (Medical): No   Lack of Transportation (Non-Medical): No  Physical Activity:    Days of Exercise per Week:    Minutes of Exercise per Session:   Stress:    Feeling of Stress :   Social Connections:    Frequency of Communication with Friends and Family:    Frequency of Social Gatherings with Friends and Family:    Attends Religious Services:    Active Member of Clubs or Organizations:    Attends 01/13/19:    Marital Status:     Family History: Family History  Problem Relation Age of Onset   Cancer Mother        melanoma   Hypertension Mother    Hyperlipidemia Father    Cancer Maternal Grandmother        breast cancer   Cancer Maternal Grandfather        skin cancer    Allergies: No Known Allergies  Medications Prior to Admission  Medication Sig Dispense Refill Last Dose   buPROPion (WELLBUTRIN XL) 150 MG 24 hr tablet Take 1 tablet (  150 mg total) by mouth daily. 90 tablet 1 07/11/2019 at Unknown time   docusate sodium (COLACE) 100 MG capsule Take 100 mg by mouth daily.   07/11/2019 at Unknown time   ferrous sulfate 324 MG TBEC Take 324 mg by mouth.   07/11/2019 at Unknown time   Prenatal Vit-Fe Fumarate-FA (PRENATAL VITAMIN PO) Take 1 tablet by mouth daily.   07/11/2019 at Unknown time     Review of Systems   All systems reviewed and negative except as stated in HPI  Blood pressure 132/86, pulse 97, temperature 98.4 F (36.9 C), temperature source Oral, resp. rate 16, height 5\' 5"  (1.651 m), weight 76.7 kg, last menstrual period 10/17/2018. General appearance: alert, cooperative and no distress Presentation: cephalic Fetal monitoringBaseline: 155 bpm, Variability: Good {> 6 bpm) and Accelerations: Reactive Uterine activity: irregular contractions  Dilation: 3 Effacement (%): 50 Station: -3, -2 Exam by:: MScott, RN   Prenatal labs: ABO, Rh: --/--/AB POS, AB POS Performed at [facility], [address], [place], [place]  [place]  [phone]) Antibody: NEG (04/30 0441) Rubella: 2.56 (11/05 1229) RPR: Non Reactive (02/25 0916)  HBsAg: Negative (11/05 1229)  HIV: Non Reactive (02/25 0916)  GBS: Negative/-- (04/16 0947)  1 hr Glucola: WNL (89)  Anatomy 08-18[birth year]1993: intracardiac echogenic focus noted at 14 wks, resolved by 22wks.   Prenatal Transfer Tool  Maternal Diabetes: No Genetic Screening: Declined Maternal Ultrasounds/Referrals: Isolated EIF (echogenic intracardiac focus) resolved on later scan Fetal Ultrasounds or other Referrals:  None Maternal Substance Abuse:  No Significant Maternal Medications:  None Significant Maternal Lab Results: Group B Strep negative  Results for orders placed or performed during the hospital encounter of 07/12/19 (from the past 24 hour(s))  POCT fern test   Collection Time: 07/12/19  4:00 AM  Result Value Ref Range   POCT Fern Test    Respiratory Panel by RT PCR (Flu A&B, Covid) - Nasopharyngeal Swab   Collection Time: 07/12/19  4:19 AM   Specimen: Nasopharyngeal Swab  Result Value Ref Range   SARS Coronavirus 2 by RT PCR NEGATIVE NEGATIVE   Influenza A by PCR NEGATIVE NEGATIVE   Influenza B by PCR NEGATIVE NEGATIVE  CBC   Collection Time: 07/12/19  4:41 AM  Result Value Ref Range   WBC 12.0 (H) 4.0 - 10.5 K/uL   RBC 4.02 3.87 - 5.11 MIL/uL   Hemoglobin 10.8 (L) 12.0 - 15.0 g/dL   HCT 07/14/19 (L) 38.7 - 56.4 %   MCV 84.6 80.0 - 100.0 fL   MCH 26.9 26.0 - 34.0 pg   MCHC 31.8 30.0 - 36.0 g/dL   RDW 33.2 95.1 - 88.4 %   Platelets 228 150 - 400 K/uL   nRBC 0.0 0.0 - 0.2 %  Type and screen [facility]   Collection Time: 07/12/19  4:41 AM  Result Value Ref Range   ABO/RH(D) AB POS    Antibody Screen NEG    Sample Expiration      07/15/2019,2359 Performed at [facility], [address], [place], [place]   ABO/Rh   Collection Time: 07/12/19  4:41 AM  Result Value Ref Range   ABO/RH(D)      AB POS Performed at [facility]  Lab, [address], [place], [place]     Patient Active Problem List   Diagnosis Date Noted   Normal labor 07/12/2019   Anemia in pregnancy 05/13/2019   Supervision of  low-risk pregnancy 12/27/2018   Moderate episode of recurrent major depressive disorder ([place]) 12/27/2016   Pes cavus 06/07/2016   Attention deficit hyperactivity disorder (ADHD) 07/21/2015    Assessment/Plan:  Mckinzie Saksa is a 24 y.o. G1P0 at [redacted]w[redacted]d here for SROM.  #Labor: SROM at 0300. At 3cm. Expectant management for now. Will recheck in 4-6 hours and consider cytotec vs. Pitocin at that time.  #Pain: Per patient request. Patient would not like an epidural at this time.  #FWB: Cat 1 #ID:  GBS Negative #MOF: Breast  #MOC: POPs #Circ:  Girl n/a  #Recurrent Major Depressive Disorder: stable on bupropion 150mg  daily #Anemia in Pregnancy: Stable on PO iron supplements  Pearla Dubonnet, Medical Student  07/12/2019, 6:30 AM  GME ATTESTATION:  I saw and evaluated the patient. I agree with the findings and the plan of care as documented in the student's note.  Merilyn Baba, DO OB Fellow, [place] for [place] 07/12/2019 8:00 AM

## 2019-07-12 NOTE — Anesthesia Preprocedure Evaluation (Signed)
Anesthesia Evaluation  Patient identified by MRN, date of birth, ID band Patient awake    Reviewed: Allergy & Precautions, H&P , Patient's Chart, lab work & pertinent test results  Airway Mallampati: I  TM Distance: >3 FB Neck ROM: full    Dental no notable dental hx. (+) Teeth Intact   Pulmonary neg pulmonary ROS,    Pulmonary exam normal breath sounds clear to auscultation       Cardiovascular negative cardio ROS   Rhythm:regular Rate:Normal     Neuro/Psych PSYCHIATRIC DISORDERS Depression negative neurological ROS     GI/Hepatic negative GI ROS, Neg liver ROS,   Endo/Other  negative endocrine ROS  Renal/GU negative Renal ROS  negative genitourinary   Musculoskeletal negative musculoskeletal ROS (+)   Abdominal Normal abdominal exam  (+)   Peds  Hematology  (+) Blood dyscrasia, anemia ,   Anesthesia Other Findings   Reproductive/Obstetrics (+) Pregnancy                             Anesthesia Physical Anesthesia Plan  ASA: II  Anesthesia Plan: Epidural   Post-op Pain Management:    Induction:   PONV Risk Score and Plan:   Airway Management Planned:   Additional Equipment:   Intra-op Plan:   Post-operative Plan:   Informed Consent: I have reviewed the patients History and Physical, chart, labs and discussed the procedure including the risks, benefits and alternatives for the proposed anesthesia with the patient or authorized representative who has indicated his/her understanding and acceptance.       Plan Discussed with:   Anesthesia Plan Comments:         Anesthesia Quick Evaluation

## 2019-07-12 NOTE — Progress Notes (Signed)
Dr Earlene Plater notified of pt's admission and status. Aware of srom at 0300 with clear fld here. Pt report pink-tinged fld. Ctx pattern reported but no SVE due to pt not aware of ctxs. Closed last sve at 36wks. Will admit to [facility]

## 2019-07-12 NOTE — Discharge Instructions (Signed)

## 2019-07-12 NOTE — Anesthesia Procedure Notes (Addendum)
Epidural Patient location during procedure: OB Start time: 07/12/2019 3:25 PM End time: 07/12/2019 3:28 PM  Staffing Anesthesiologist: Leilani Able, MD Performed: anesthesiologist   Preanesthetic Checklist Completed: patient identified, IV checked, site marked, risks and benefits discussed, surgical consent, monitors and equipment checked, pre-op evaluation and timeout performed  Epidural Patient position: sitting Prep: DuraPrep and site prepped and draped Patient monitoring: continuous pulse ox and blood pressure Approach: midline Location: L3-L4 Injection technique: LOR air  Needle:  Needle type: Tuohy  Needle gauge: 17 G Needle length: 9 cm and 9 Needle insertion depth: 5 cm cm Catheter type: closed end flexible Catheter size: 19 Gauge Catheter at skin depth: 10 cm Test dose: negative and Other  Assessment Events: blood not aspirated, injection not painful, no injection resistance, no paresthesia and negative IV test  Additional Notes Reason for block:procedure for pain

## 2019-07-12 NOTE — Progress Notes (Signed)
Hannah Duncan is a 24 y.o. G1P0 at [redacted]w[redacted]d by LMP admitted for rupture of membranes  Subjective: Requested to do cervical exam by RN  Objective: BP 123/76   Pulse 77   Temp 98.4 F (36.9 C) (Oral)   Resp 16   Ht 5\' 5"  (1.651 m)   Wt 76.7 kg   LMP 10/17/2018 (Exact Date)   BMI 28.12 kg/m  No intake/output data recorded. No intake/output data recorded.  FHT:  FHR: 130 bpm, variability: moderate,  accelerations:  Present,  decelerations:  Absent UC:   irregular, every 2.5-4 minutes SVE:   Dilation: 4 Effacement (%): 70 Station: -1 Exam by:: [email or web address] CNM  Labs: Lab Results  Component Value Date   WBC 12.0 (H) 07/12/2019   HGB 10.8 (L) 07/12/2019   HCT 34.0 (L) 07/12/2019   MCV 84.6 07/12/2019   PLT 228 07/12/2019    Assessment / Plan: Spontaneous labor, progressing normally  Labor: Progressing normally Preeclampsia:  n/a Fetal Wellbeing:  Category I Pain Control:  Nitrous Oxide I/D:  n/a Anticipated MOD:  NSVD  07/14/2019, CNM 07/12/2019, 9:50 AM

## 2019-07-12 NOTE — MAU Note (Signed)
Covid swab obtained without difficulty and pt tol well. No symptoms 

## 2019-07-12 NOTE — MAU Note (Signed)
Leaking fld for an hour. Pink tinged. Have soaked a pad in last hour. Alittle cramping

## 2019-07-12 NOTE — Progress Notes (Signed)
FHR 130 when EFM removed for transfer to [facility]

## 2019-07-12 NOTE — Progress Notes (Addendum)
Patient ID: Hannah Duncan, female   DOB: 04/08/1995, 24 y.o.   MRN: [id number] Labor Progress Note Shanesha Bednarz is a 24 y.o. G1P0 at [redacted]w[redacted]d presented for SROM. S: Comfortable with epidural. No acute complaints at this time.   O:  BP (!) 100/57   Pulse 79   Temp 98.4 F (36.9 C) (Oral)   Resp 16   Ht 5\' 5"  (1.651 m)   Wt 76.7 kg   LMP 10/17/2018 (Exact Date)   SpO2 100%   BMI 28.12 kg/m  EFM: 125/moderate/accels present  CVE: Dilation: 7 Effacement (%): 100 Cervical Position: Posterior Station: -1 Presentation: Vertex Exam by:: [email or web address] RN   A&P: 24 y.o. G1P0 [redacted]w[redacted]d here for SROM. #Labor: Continue pitocin. Anticipate NSVD  #Pain: Epidural placed  #FWB: Cat 1. #GBS negative   [redacted]w[redacted]d, Medical Student 8:44 PM  GME ATTESTATION:  I saw and evaluated the patient. I agree with the findings and the plan of care as documented in the student's note.  Dorothyann Gibbs, DO OB Fellow, Faculty [facility], Center for [facility] 07/13/2019 12:37 AM

## 2019-07-12 NOTE — MAU Provider Note (Signed)
S: Hannah Duncan is a 24 y.o. G1P0 at [redacted]w[redacted]d  who presents to MAU today for labor evaluation.     Cervical exam by RN:     Fetal Monitoring: Baseline: 135 Variability: average Accelerations: present Decelerations: absent Contractions: irregular  Pt informed that the ultrasound is considered a limited OB ultrasound and is not intended to be a complete ultrasound exam.  Patient also informed that the ultrasound is not being completed with the intent of assessing for fetal or placental anomalies or any pelvic abnormalities.  Explained that the purpose of today's ultrasound is to assess for presentation.  Patient acknowledges the purpose of the exam and the limitations of the study.    Fetus is in longitudinal lie, vertex presentation.   MDM Discussed patient with RN. NST reviewed.   A: SIUP at [redacted]w[redacted]d  PROM at term  P: Admit   Aviva Signs, CNM 07/12/2019 4:08 AM

## 2019-07-13 ENCOUNTER — Encounter (HOSPITAL_COMMUNITY): Payer: Self-pay | Admitting: Obstetrics and Gynecology

## 2019-07-13 LAB — CBC
HCT: 29.3 % — ABNORMAL LOW (ref 36.0–46.0)
Hemoglobin: 9.5 g/dL — ABNORMAL LOW (ref 12.0–15.0)
MCH: 27 pg (ref 26.0–34.0)
MCHC: 32.4 g/dL (ref 30.0–36.0)
MCV: 83.2 fL (ref 80.0–100.0)
Platelets: 189 10*3/uL (ref 150–400)
RBC: 3.52 MIL/uL — ABNORMAL LOW (ref 3.87–5.11)
RDW: 14.1 % (ref 11.5–15.5)
WBC: 16.2 10*3/uL — ABNORMAL HIGH (ref 4.0–10.5)
nRBC: 0 % (ref 0.0–0.2)

## 2019-07-13 MED ORDER — TETANUS-DIPHTH-ACELL PERTUSSIS 5-2.5-18.5 LF-MCG/0.5 IM SUSP: 0.5000 mL | Freq: Once | INTRAMUSCULAR | Status: DC

## 2019-07-13 MED ORDER — DIPHENHYDRAMINE HCL 25 MG PO CAPS: 25.0000 mg | ORAL_CAPSULE | Freq: Four times a day (QID) | ORAL | Status: DC | PRN

## 2019-07-13 MED ORDER — AMMONIA AROMATIC IN INHA
RESPIRATORY_TRACT | Status: AC
  Filled 2019-07-13: qty 10

## 2019-07-13 MED ORDER — DIBUCAINE (PERIANAL) 1 % EX OINT: 1.0000 "application " | TOPICAL_OINTMENT | CUTANEOUS | Status: DC | PRN

## 2019-07-13 MED ORDER — ZOLPIDEM TARTRATE 5 MG PO TABS: 5.0000 mg | ORAL_TABLET | Freq: Every evening | ORAL | Status: DC | PRN

## 2019-07-13 MED ORDER — SIMETHICONE 80 MG PO CHEW: 80.0000 mg | CHEWABLE_TABLET | ORAL | Status: DC | PRN

## 2019-07-13 MED ORDER — WITCH HAZEL-GLYCERIN EX PADS: 1.0000 "application " | MEDICATED_PAD | CUTANEOUS | Status: DC | PRN

## 2019-07-13 MED ORDER — FERROUS SULFATE 325 (65 FE) MG PO TABS
325.0000 mg | ORAL_TABLET | ORAL | Status: DC
  Administered 2019-07-13: 325 mg via ORAL
  Filled 2019-07-13: qty 1

## 2019-07-13 MED ORDER — PRENATAL MULTIVITAMIN CH
1.0000 | ORAL_TABLET | Freq: Every day | ORAL | Status: DC
  Administered 2019-07-13: 1 via ORAL
  Filled 2019-07-13: qty 1

## 2019-07-13 MED ORDER — SENNOSIDES-DOCUSATE SODIUM 8.6-50 MG PO TABS
2.0000 | ORAL_TABLET | ORAL | Status: DC
  Administered 2019-07-14: 2 via ORAL
  Filled 2019-07-13: qty 2

## 2019-07-13 MED ORDER — IBUPROFEN 600 MG PO TABS
600.0000 mg | ORAL_TABLET | Freq: Four times a day (QID) | ORAL | Status: DC
  Administered 2019-07-13 – 2019-07-14 (×4): 600 mg via ORAL
  Filled 2019-07-13 (×5): qty 1

## 2019-07-13 MED ORDER — BENZOCAINE-MENTHOL 20-0.5 % EX AERO
1.0000 "application " | INHALATION_SPRAY | CUTANEOUS | Status: DC | PRN
  Administered 2019-07-13 – 2019-07-14 (×2): 1 via TOPICAL
  Filled 2019-07-13 (×2): qty 56

## 2019-07-13 MED ORDER — ONDANSETRON HCL 4 MG PO TABS: 4.0000 mg | ORAL_TABLET | ORAL | Status: DC | PRN

## 2019-07-13 MED ORDER — ONDANSETRON HCL 4 MG/2ML IJ SOLN: 4.0000 mg | INTRAMUSCULAR | Status: DC | PRN

## 2019-07-13 MED ORDER — ACETAMINOPHEN 325 MG PO TABS
650.0000 mg | ORAL_TABLET | ORAL | Status: DC | PRN
  Administered 2019-07-13 (×2): 650 mg via ORAL
  Filled 2019-07-13 (×2): qty 2

## 2019-07-13 MED ORDER — COCONUT OIL OIL: 1.0000 "application " | TOPICAL_OIL | Status: DC | PRN

## 2019-07-13 NOTE — Discharge Summary (Signed)
Postpartum Discharge Summary       Patient Name: Hannah Duncan DOB: 1996-02-15 MRN: [id number]  Date of admission: 07/12/2019 Delivering Provider: Merilyn Baba   Date of discharge: 07/14/2019  Admitting diagnosis: Normal labor [O80, Z37.9] Intrauterine pregnancy: [redacted]w[redacted]d    Secondary diagnosis:  Active Problems:   Normal labor   Full-term premature rupture of membranes  Additional problems: None     Discharge diagnosis: Term Pregnancy Delivered                                                                                                Post partum procedures:None  Augmentation: Pitocin  Complications: None  Hospital course:  Onset of Labor With Vaginal Delivery     24y.o. yo G1P0 at 312w3das admitted in Latent Labor with SROM on 07/12/2019. Patient had an uncomplicated labor course as follows:   She was admitted after SROM. Pitocin was added for labor augmentation. She progressed to complete and delivered shortly after.  Membrane Rupture Time/Date: 3:00 AM ,07/12/2019   Intrapartum Procedures: Episiotomy: None [1]                                         Lacerations:  2nd degree [3];Periurethral [8];Labial [10]  Patient had a delivery of a Viable infant. 07/12/2019  Information for the patient's newborn:  OsReesa, Gotschallirl Ireene [[id number]]Delivery Method: Vaginal, Spontaneous(Filed from Delivery Summary)     Pateint had an uncomplicated postpartum course. EBL during delivery was 860 mL, repeat CBC showed Hgb 10.8>9.5. She was started on PO iron. She is ambulating, tolerating a regular diet, passing flatus, and urinating well. Patient is discharged home in stable condition on 07/14/19.  Delivery time: 11:10 PM    Magnesium Sulfate received: No BMZ received: No Rhophylac:N/A MMR:N/A Transfusion:No  Physical exam  Vitals:   07/13/19 1300 07/13/19 2020 07/13/19 2100 07/14/19 0509  BP: 121/73 114/73 (!) 99/54 (!) 105/59  Pulse: 90 100 70 95  Resp: '18 16 16 15   '$ Temp:  97.7 F (36.5 C) 98 F (36.7 C) 97.8 F (36.6 C)  TempSrc:  Oral  Oral  SpO2: 100% 100% 99% 100%  Weight:      Height:       General: alert, cooperative and no distress Lochia: appropriate Uterine Fundus: firm Incision: N/A DVT Evaluation: No evidence of DVT seen on physical exam. Labs: Lab Results  Component Value Date   WBC 16.2 (H) 07/13/2019   HGB 9.5 (L) 07/13/2019   HCT 29.3 (L) 07/13/2019   MCV 83.2 07/13/2019   PLT 189 07/13/2019   CMP Latest Ref Rng & Units 09/21/2016  Glucose 65 - 99 mg/dL 82  BUN 7 - 25 mg/dL 9  Creatinine 0.50 - 1.10 mg/dL 0.60  Sodium 135 - 146 mmol/L 137  Potassium 3.5 - 5.3 mmol/L 3.9  Chloride 98 - 110 mmol/L 100  CO2 20 - 31 mmol/L 24  Calcium 8.6 - 10.2 mg/dL 9.8  Total Protein 6.1 -  8.1 g/dL 7.3  Total Bilirubin 0.2 - 1.2 mg/dL 0.6  Alkaline Phos 33 - 115 U/L 77  AST 10 - 30 U/L 29  ALT 6 - 29 U/L 27   Edinburgh Score: Edinburgh Postnatal Depression Scale Screening Tool 07/13/2019  I have been able to laugh and see the funny side of things. (No Data)    Discharge instruction: per After Visit Summary and "Baby and Me Booklet".  After visit meds:  Allergies as of 07/14/2019   No Known Allergies     Medication List    TAKE these medications   acetaminophen 325 MG tablet Commonly known as: Tylenol Take 2 tablets (650 mg total) by mouth every 4 (four) hours as needed (for pain scale < 4).   buPROPion 150 MG 24 hr tablet Commonly known as: WELLBUTRIN XL Take 1 tablet (150 mg total) by mouth daily.   docusate sodium 100 MG capsule Commonly known as: COLACE Take 100 mg by mouth daily.   ferrous sulfate 324 MG Tbec Take 324 mg by mouth.   ibuprofen 600 MG tablet Commonly known as: ADVIL Take 1 tablet (600 mg total) by mouth every 6 (six) hours.   PRENATAL VITAMIN PO Take 1 tablet by mouth daily.       Diet: routine diet  Activity: Advance as tolerated. Pelvic rest for 6 weeks.   Outpatient follow up:6  weeks Follow up Appt: Future Appointments  Date Time Provider [place]  07/22/2019 11:15 AM Nugent, Gerrie Nordmann, NP [facility]  07/29/2019 11:15 AM Nugent, Gerrie Nordmann, NP [facility]   Follow up Visit: Please schedule this patient for Postpartum visit in: 6 weeks with the following provider: Any provider Virtual ok For C/S patients schedule nurse incision check in weeks 2 weeks: no Low risk pregnancy complicated by: None Delivery mode:  SVD Anticipated Birth Control:  POPs PP Procedures needed: OCPs  Schedule Integrated Aspers visit: no  Newborn Data: Live born female  Birth Weight: 3586 g  APGAR: 72, 9  Newborn Delivery   Birth date/time: 07/12/2019 23:10:00 Delivery type: Vaginal, Spontaneous      Baby Feeding: Breast Disposition:home with mother   07/14/2019 Merilyn Baba, DO

## 2019-07-13 NOTE — Anesthesia Postprocedure Evaluation (Signed)
Anesthesia Post Note  Patient: Hannah Duncan  Procedure(s) Performed: AN AD HOC LABOR EPIDURAL     Patient location during evaluation: Mother Baby Anesthesia Type: Epidural Level of consciousness: awake and alert Pain management: pain level controlled Vital Signs Assessment: post-procedure vital signs reviewed and stable Respiratory status: spontaneous breathing, nonlabored ventilation and respiratory function stable Cardiovascular status: stable Postop Assessment: no headache, no backache and epidural receding Anesthetic complications: no    Last Vitals:  Vitals:   07/13/19 0217 07/13/19 0604  BP: 120/72 115/65  Pulse: 82 84  Resp: 20 20  Temp: 37.3 C 37.4 C  SpO2: 100% 98%    Last Pain:  Vitals:   07/13/19 0604  TempSrc: Oral  PainSc: 0-No pain   Pain Goal: Patients Stated Pain Goal: 3 (07/12/19 0554)                 Emmaline Kluver N

## 2019-07-13 NOTE — Progress Notes (Signed)
CSW received consult for hx of depression.   CSW met with MOB at bedside to offer support and complete assessment. FOB and infant Saoirse were present on arrival, however, FOB stepped out of room,  after PPD/A and SIDS education, to offer MOB privacy during assessment. MOB and FOB were pleasant and engaged during visit.  MOB reported depression and ADHD history. MOB denied any other mental health dx or concerns. MOB identified sx as intense lack of interest and isolation. MOB reported sx have been controlled with Wellbutrin Rx and therapy. MOB reported she discontinue rx for about 4 month during pregnancy due to safety concerns but decided to restart after research. MOB has taken rx during this hospital stay. MOB denied any SI, HI, or domestic violence. MOB reported plan to increase visits with therapist as a proactive measure postpartum. MOB reported current mood as "happy". MOB identified FOB, mom, and 2 sisters as supports. MOB identified talking with loved ones, walking, and crocheting as coping skills.  CSW provided education regarding the baby blues period vs. perinatal mood disorders, discussed treatment and gave resources for mental health follow up if concerns arise.  CSW recommends self-evaluation during the postpartum time period using the New Mom Checklist from Postpartum Progress and encouraged MOB and FOB  to contact a medical professional if symptoms are noted at any time.  MOB and FOB denied any questions.  CSW provided review of Sudden Infant Death Syndrome (SIDS) precautions. MOB and FOB confirmed having all needed items for baby including car seat and bassinet for baby's safe sleep.    CSW identifies no further need for intervention and no barriers to discharge at this time.  Caileb Rhue D. Elle Vezina, MSW, LCSWA Clinical Social Worker [phone] 

## 2019-07-13 NOTE — Lactation Note (Signed)
This note was copied from a baby's chart. Lactation Consultation Note  Patient Name: Hannah Duncan [id number] Date: 07/13/2019 Reason for consult: Initial assessment;1st time breastfeeding;Early term 37-38.6wks P1, 7 hour female infant. 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 Infant had one stool since birth. Per mom, infant breastfeed for 20 minutes in L&D . LC did not observe latch at this time due to mom breastfeeding infant 45 minutes prior to [facility] entering the room. Mom feels breastfeeding is getter better as infant age. Mom knows to breastfeed infant according to cues, 8 to 12 times within 24 hours on demand. Mom will continue to do STS and ask for help with latch if needed,'Mom made aware of O/P services, breastfeeding support groups, community resources, and our phone # for post-discharge questions.  Maternal Data Formula Feeding for Exclusion: No Does the patient have breastfeeding experience prior to this delivery?: No  Feeding Feeding Type: Breast Fed  LATCH Score Latch: Repeated attempts needed to sustain latch, nipple held in mouth throughout feeding, stimulation needed to elicit sucking reflex.  Audible Swallowing: A few with stimulation  Type of Nipple: Everted at rest and after stimulation  Comfort (Breast/Nipple): Soft / non-tender  Hold (Positioning): Assistance needed to correctly position infant at breast and maintain latch.  LATCH Score: 7  Interventions Interventions: Breast feeding basics reviewed;Skin to skin;Hand pump  Lactation Tools Discussed/Used Tools: Shells;Pump Shell Type: Sore Breast pump type: Manual WIC Program: No   Consult Status Consult Status: Follow-up Follow-up type: In-patient    Danelle Earthly 07/13/2019, 6:31 AM

## 2019-07-13 NOTE — Progress Notes (Addendum)
Patient ID: Hannah Duncan, female   DOB: [birth year], 24 y.o.   MRN: [id number] Post Partum Day 1 Subjective: no complaints, up ad lib, voiding, tolerating PO and + flatus  Objective: Blood pressure 115/65, pulse 84, temperature 99.3 F (37.4 C), temperature source Oral, resp. rate 20, height 5\' 5"  (1.651 m), weight 76.7 kg, last menstrual period 10/17/2018, SpO2 98 %, unknown if currently breastfeeding.  Physical Exam:  General: alert, cooperative and no distress Lochia: appropriate Uterine Fundus: firm DVT Evaluation: No evidence of DVT seen on physical exam.  Recent Labs    07/12/19 0441 07/13/19 0504  HGB 10.8* 9.5*  HCT 34.0* 29.3*    Assessment/Plan: Plan for discharge tomorrow, Breastfeeding and Contraception (POPs)  Hgb 10.8>9.5: PO iron started   LOS: 1 day   09/12/19  GME ATTESTATION:  I saw and evaluated the patient. I agree with the findings and the plan of care as documented in the student's note.  Dorothyann Gibbs, DO OB Fellow, Faculty [facility], Center for [facility] 07/13/2019 8:46 AM

## 2019-07-13 NOTE — Lactation Note (Signed)
This note was copied from a baby's chart. Lactation Consultation Note  Patient Name: Hannah Duncan WUXLK'G Date: 07/13/2019 Reason for consult: Follow-up assessment;Mother's request;1st time breastfeeding;Early term 37-38.6wks  1730 - 1748 - LC responded to page from Ms. Townsel. She requested an employee pump, and she asked for the Freestyle. I provided that to her and made a copy of her insurance information.   While in room, I asked if she was doing okay with breast feeding. She reports that baby "Saoirse" had a good 30 minute feeding around midday with less consistent latching this afternoon.  Baby was cueing while I room. I asked Ms. Osipeko with latching her in cradle hold to the right breast. I encouraged her to hand express first and allow baby to lick and learn. Baby licked and became focused and latched. Baby needed some guidance to hold latch. She would release breast and with compressions she would go back on. Good suckling sequences noted, but not sustained for long periods of time.  I showed her support person (dad) how to help with breast compressions and sandwiching the breast.  Ms. Elsbernd took a breast feeding class. She is familiar with cluster feeding behavior. I recommended that she breast feed 8-12 times a day on demand, and we discussed possible cluster feeding overnight. Recommended rest between feedings today.  No further questions at this time.   Maternal Data Has patient been taught Hand Expression?: Yes Does the patient have breastfeeding experience prior to this delivery?: No  Feeding Feeding Type: Breast Fed  LATCH Score Latch: Repeated attempts needed to sustain latch, nipple held in mouth throughout feeding, stimulation needed to elicit sucking reflex.  Audible Swallowing: A few with stimulation  Type of Nipple: Everted at rest and after stimulation  Comfort (Breast/Nipple): Soft / non-tender  Hold (Positioning): Assistance needed to correctly  position infant at breast and maintain latch.  LATCH Score: 7  Interventions Interventions: Breast feeding basics reviewed;Assisted with latch;Hand express;Breast compression(personal pump - [facility] employee)  Lactation Tools Discussed/Used     Consult Status Consult Status: Follow-up Date: 07/14/19 Follow-up type: In-patient    Walker Shadow 07/13/2019, 6:02 PM

## 2019-07-14 ENCOUNTER — Ambulatory Visit: Payer: Self-pay

## 2019-07-14 MED ORDER — ACETAMINOPHEN 325 MG PO TABS: 650.0000 mg | ORAL_TABLET | ORAL | 0 refills | Status: DC | PRN

## 2019-07-14 MED ORDER — IBUPROFEN 600 MG PO TABS: 600.0000 mg | ORAL_TABLET | Freq: Four times a day (QID) | ORAL | 0 refills | Status: DC

## 2019-07-14 NOTE — Progress Notes (Signed)
AVS printed and discharged instructions given to patient. Patient informed to call for follow-up appointment. All questions answered and pt verbalized understanding.

## 2019-07-14 NOTE — Lactation Note (Signed)
This note was copied from a baby's chart. Lactation Consultation Note:  Mother is a P59, infant is 51 hours old  Mother reports that infant is feeding well.  Reviewed hand expression with mother. Observed large drops of colostrum. Mother was given a harmony hand pump with instructions. Mothers nipples are erect with compressible breast tissue. No observed trama of mothers nipples.  Mother is a [facility] Emp and has her [facility] pump at the bedside.   Mother attempting to latch infant when [facility] arrived in the room. Infant holding nipple in her mouth. Several attempts to rouse infant and infant still showing no feeding cues.  Infant was observed crying and this LC observed that infant has a slight posterior tongue tie with cupping of her tongue . When informed mother of this she reports that she, her sister and her mother also have tongue ties.   Discussed that this maybe an indication to watch infant for milk transfer and to make sure that she protects her milk supply.   Plan of Care : Breastfeed infant with feeding cues Supplement infant with ebm, according to supplemental guidelines. Pump using a DEBP after each feeding for 15-20 mins. 3-4 times daily to protect milk supply.   Mother to continue to cue base feed infant and feed at least 8-12 times or more in 24 hours and advised to allow for cluster feeding infant as needed.   Mother to continue to due STS. Mother is aware of available LC services at [facility], BFSG'S, OP Dept, and phone # for questions or concerns about breastfeeding.  Mother receptive to all teaching and plan of care.    Patient Name: Girl Christinamarie Tall NIOEV'O Date: 07/14/2019 Reason for consult: Follow-up assessment   Maternal Data    Feeding Feeding Type: Breast Fed  LATCH Score                   Interventions    Lactation Tools Discussed/Used     Consult Status Consult Status: Complete    Michel Bickers 07/14/2019, 11:59 AM

## 2019-07-15 MED FILL — IBUPROFEN 600 MG TABLET: 600 | 7 days supply | Qty: 30 | Fill #0

## 2019-07-19 ENCOUNTER — Ambulatory Visit (INDEPENDENT_AMBULATORY_CARE_PROVIDER_SITE_OTHER): Payer: No Typology Code available for payment source | Admitting: Clinical

## 2019-07-19 ENCOUNTER — Telehealth: Payer: Self-pay | Admitting: Family Medicine

## 2019-07-19 ENCOUNTER — Telehealth: Payer: Self-pay

## 2019-07-19 ENCOUNTER — Encounter: Payer: Self-pay | Admitting: Student

## 2019-07-19 DIAGNOSIS — F4323 Adjustment disorder with mixed anxiety and depressed mood: Secondary | ICD-10-CM

## 2019-07-19 NOTE — Telephone Encounter (Signed)
Patient states "parent hood sucks" States its been a week and she hasn't done anything for herself. Her husband suffers from depression also and it makes the whole thing worse for her. Her mom has stepped in to help.  Has spoken to family about situation and everyone is aware.  Got her set up with Asher Muir and it was 5 days away patient requested a conversation today with Asher Muir  Unable to do ed[place] because of crying. States she needs sometype of medicine today to help her get focused to care for her baby. Patient ended the call and agreed that Asher Muir would be calling her within 5 mins.

## 2019-07-19 NOTE — BH Specialist Note (Signed)
Integrated Behavioral Health via Telemedicine Video Visit  Patient and/or legal guardian verbally consented to [facility] services about presenting concerns and psychiatric consultation as appropriate.   07/19/2019 Hazeline Charnley [id number]  Number of [place] visits: 1 Session Start time: 9:07  Session End time: 9:52 Total time: 45   Referring Provider: Noni Saupe, NP Type of Visit: Video Patient/Family location: Home [facility] Provider location: Center for [place] [facility]  All persons participating in visit: Patient Hannah Duncan and [place]    Confirmed patient's address: Yes  Confirmed patient's phone number: Yes  Any changes to demographics: No   Confirmed patient's insurance: Yes  Any changes to patient's insurance: No   Discussed confidentiality: Yes   I connected with Razia Fuhrman  by a video enabled telemedicine application and verified that I am speaking with the correct person using two identifiers.     I discussed the limitations of evaluation and management by telemedicine and the availability of in person appointments.  I discussed that the purpose of this visit is to provide behavioral health care while limiting exposure to the novel coronavirus.   Discussed there is a possibility of technology failure and discussed alternative modes of communication if that failure occurs.  I discussed that engaging in this video visit, they consent to the provision of behavioral healthcare and the services will be billed under their insurance.  Patient and/or legal guardian expressed understanding and consented to video visit: Yes   PRESENTING CONCERNS: Patient and/or family reports the following symptoms/concerns: Pt states her primary concern is an increase in symptoms of depression and anxiety postpartum; pt is taking Wellbutrin and iron as prescribed, and is waiting to hear back from  therapist to begin services.  Duration of problem: One week (postpartum); Severity of problem: severe  STRENGTHS (Protective Factors/Coping Skills): Supportive family; open to treatment  GOALS ADDRESSED: Patient will: 1.  Reduce symptoms of: anxiety, depression and stress  2.  Increase knowledge and/or ability of: healthy habits and stress reduction  3.  Demonstrate ability to: Increase healthy adjustment to current life circumstances and Increase adequate support systems for patient/family  INTERVENTIONS: Interventions utilized:  Sleep Hygiene, Psychoeducation and/or Health Education and Link to [employer] Standardized Assessments completed: GAD-7 and PHQ 9  ASSESSMENT: Patient currently experiencing Adjustment disorder with mixed anxiety and depressed mood  Patient may benefit from psychoeducation and brief therapeutic interventions regarding coping with symptoms of anxiety, depression, and current life stress .  PLAN: 1. Follow up with behavioral health clinician on : One week 2. Behavioral recommendations:  -Continue taking Wellbutrin, prenatal vitamin and iron pills, as prescribed -Continue with plan to allow mother to keep baby all night tonight.  -Prioritize healthy sleep nightly(sleep when baby sleeps as much as possible) -Consider taking 10-20 minute nap during baby's nap time (and immediately after drinking morning tea) -Continue with plan to establish care with new therapist -Consider registering for and attending at least one new mom (and new dad) support group(s) at either conehealthybaby.com (Mom Talk) and/or postpartum.net  3. Referral(s): Integrated Orthoptist (In Clinic) and [employer] Resources:  New mom support  I discussed the assessment and treatment plan with the patient and/or parent/guardian. They were provided an opportunity to ask questions and all were answered. They agreed with the plan and demonstrated an understanding of the  instructions.   They were advised to call back or seek an in-person evaluation if the symptoms worsen or  if the condition fails to improve as anticipated.  Valetta Close Zanna Hawn  Depression screen [facility] 2/9 07/19/2019 07/09/2019 07/04/2019 06/28/2019 06/10/2019  Decreased Interest 3 0 0 0 0  Down, Depressed, Hopeless 3 0 0 0 0  PHQ - 2 Score 6 0 0 0 0  Altered sleeping 2 1 0 0 0  Tired, decreased energy 3 1 1 1  0  Change in appetite 3 0 0 0 0  Feeling bad or failure about yourself  3 0 0 0 0  Trouble concentrating 3 0 0 0 0  Moving slowly or fidgety/restless 1 0 0 0 0  Suicidal thoughts 0 0 0 0 0  PHQ-9 Score 21 2 1 1  0  Some recent data might be hidden   GAD 7 : Generalized Anxiety Score 07/19/2019 07/09/2019 07/04/2019 06/28/2019  Nervous, Anxious, on Edge 3 0 0 1  Control/stop worrying 3 0 0 0  Worry too much - different things 3 0 0 0  Trouble relaxing 3 0 0 0  Restless 3 0 0 0  Easily annoyed or irritable 1 0 0 0  Afraid - awful might happen 2 0 0 0  Total GAD 7 Score 18 0 0 1

## 2019-07-19 NOTE — Telephone Encounter (Signed)
Patient called in stating that she needs to cancel her upcoming appointments and make her postpartum visit because she has delivered her baby. Patients appointments were canceled and postpartum visit was made. Patient stated that she needed to speak to a nurse because she thinks she could be possibly experiencing postpartum depression. Patient was very tearful when speaking on the phone. Asher Muir was unavailable and nurses were tied up. Patient instructed that a nurse will contacting her as soon as they can to speak to her about this mater. Patient verbalized understanding. Message sent to clinical pool.

## 2019-07-19 NOTE — Patient Instructions (Signed)
  Hannah Duncan's office number [phone]  New mom support groups: [email or web address].com (Mom Talk); postpartum.net (new mom and new dad support)  Fussy baby: "Happiest Baby on the Block" (book/video/may find quick tips online), if needed

## 2019-07-22 ENCOUNTER — Encounter: Payer: No Typology Code available for payment source | Admitting: Women's Health

## 2019-07-22 DIAGNOSIS — Z029 Encounter for administrative examinations, unspecified: Secondary | ICD-10-CM

## 2019-07-23 NOTE — BH Specialist Note (Signed)
Integrated Behavioral Health via Telemedicine Video Visit  07/23/2019 Anaja Monts [id number]  Number of Integrated Behavioral Health visits: 2 Session Start time: 10:45  Session End time: 11:00 Total time: 15  Referring Provider: Venia Carbon, NP Type of Visit: Video Patient/Family location: Home [facility] Provider location: [facility]  All persons participating in visit: Patient Hannah Duncan and [facility] Aryaan Persichetti    Confirmed patient's address: Yes  Confirmed patient's phone number: Yes  Any changes to demographics: No   Confirmed patient's insurance: Yes  Any changes to patient's insurance: No   Discussed confidentiality: at previous visit  I connected with Westlynn Alvelo by a video enabled telemedicine application and verified that I am speaking with the correct person using two identifiers.     I discussed the limitations of evaluation and management by telemedicine and the availability of in person appointments.  I discussed that the purpose of this visit is to provide behavioral health care while limiting exposure to the novel coronavirus.   Discussed there is a possibility of technology failure and discussed alternative modes of communication if that failure occurs.  I discussed that engaging in this video visit, they consent to the provision of behavioral healthcare and the services will be billed under their insurance.  Patient and/or legal guardian expressed understanding and consented to video visit: Yes   PRESENTING CONCERNS: Patient and/or family reports the following symptoms/concerns: Pt states her primary goal today is referral to psychiatry for ongoing [facility] medication management Duration of problem: Postpartum; Severity of problem: severe  STRENGTHS (Protective Factors/Coping Skills): Supportive family; open to treatment   GOALS ADDRESSED: Patient will: 1.  Reduce symptoms of: anxiety, depression and stress   2.  Demonstrate ability to: Increase healthy adjustment to current life circumstances  INTERVENTIONS: Interventions utilized:  Supportive Counseling and Link to [employer] Standardized Assessments completed: GAD-7 and PHQ 9  ASSESSMENT: Patient currently experiencing Adjustment disorder with mixed anxiety and depressed mood.   Patient may benefit from continued psychoeducation and brief therapeutic interventions regarding coping with symptoms of anxiety and depression. Marland Kitchen  PLAN: 1. Follow up with behavioral health clinician on : [facility] will call in one week or call [facility] at [phone] 2. Behavioral recommendations:  -Accept referral to psychiatry for ongoing BH medication management -Consider joining online new mom support group at postpartum.net for additional support -Continue plan to establish with ongoing therapist   -Continue taking Wellbutrin as prescribed 3. Referral(s): Integrated Art gallery manager (In Clinic) and [employer] [facility] (LME/Outside Clinic)  I discussed the assessment and treatment plan with the patient and/or parent/guardian. They were provided an opportunity to ask questions and all were answered. They agreed with the plan and demonstrated an understanding of the instructions.   They were advised to call back or seek an in-person evaluation if the symptoms worsen or if the condition fails to improve as anticipated.  Valetta Close [facility]  Depression screen [facility] 2/9 07/19/2019 07/09/2019 07/04/2019 06/28/2019 06/10/2019  Decreased Interest 3 0 0 0 0  Down, Depressed, Hopeless 3 0 0 0 0  PHQ - 2 Score 6 0 0 0 0  Altered sleeping 2 1 0 0 0  Tired, decreased energy 3 1 1 1  0  Change in appetite 3 0 0 0 0  Feeling bad or failure about yourself  3 0 0 0 0  Trouble concentrating 3 0 0 0 0  Moving slowly or fidgety/restless 1 0 0 0 0  Suicidal thoughts 0 0 0 0 0  PHQ-9 Score 21 2 1 1  0  Some recent data might be hidden   GAD 7 : Generalized  Anxiety Score 07/19/2019 07/09/2019 07/04/2019 06/28/2019  Nervous, Anxious, on Edge 3 0 0 1  Control/stop worrying 3 0 0 0  Worry too much - different things 3 0 0 0  Trouble relaxing 3 0 0 0  Restless 3 0 0 0  Easily annoyed or irritable 1 0 0 0  Afraid - awful might happen 2 0 0 0  Total GAD 7 Score 18 0 0 1

## 2019-07-24 ENCOUNTER — Institutional Professional Consult (permissible substitution): Payer: No Typology Code available for payment source

## 2019-07-25 ENCOUNTER — Encounter: Payer: Self-pay | Admitting: Family Medicine

## 2019-07-25 ENCOUNTER — Telehealth (INDEPENDENT_AMBULATORY_CARE_PROVIDER_SITE_OTHER): Payer: No Typology Code available for payment source | Admitting: Obstetrics and Gynecology

## 2019-07-25 DIAGNOSIS — F418 Other specified anxiety disorders: Secondary | ICD-10-CM

## 2019-07-25 DIAGNOSIS — O99345 Other mental disorders complicating the puerperium: Secondary | ICD-10-CM

## 2019-07-25 NOTE — Progress Notes (Signed)
I connected with  Hannah Duncan on 07/25/19 at  9:35 AM EDT by telephone and verified that I am speaking with the correct person using two identifiers.   I discussed the limitations, risks, security and privacy concerns of performing an evaluation and management service by telephone and the availability of in person appointments. I also discussed with the patient that there may be a patient responsible charge related to this service. The patient expressed understanding and agreed to proceed.  Janene Madeira Zaryan Yakubov, CMA 07/25/2019  9:17 AM

## 2019-07-25 NOTE — Progress Notes (Signed)
TELEHEALTH VIRTUAL POSTPARTUM VISIT ENCOUNTER NOTE  I connected with@ on 07/26/19 at  9:35 AM EDT by telephone at home and verified that I am speaking with the correct person using two identifiers.   I discussed the limitations, risks, security and privacy concerns of performing an evaluation and management service by telephone and the availability of in person appointments. I also discussed with the patient that there may be a patient responsible charge related to this service. The patient expressed understanding and agreed to proceed.  Appointment Date: 07/26/2019  OBGYN Clinic: [facility]   Chief Complaint:  Postpartum anxiety   History of Present Illness: Hannah Duncan is a 24 y.o. Caucasian G1P1001 Status post vaginal delivery on 5/1. She recently saw [facility].  Complains of postpartum anxiety. She has a history of depression and a long standing history of mental health issues. States since the birth of her daughter her anxiety has been worse. States her husband is also very anxious and that is making her anxiety worse. She is staying with her parents at this time so she has time to rest and has help with baby. She has been trying to get into see a therapist however most schedules are full.    Patient Active Problem List   Diagnosis Date Noted  . Normal labor 07/12/2019  . Full-term premature rupture of membranes   . Anemia in pregnancy 05/13/2019  . Supervision of low-risk pregnancy 12/27/2018  . Moderate episode of recurrent major depressive disorder ([place]) 12/27/2016  . Pes cavus 06/07/2016  . Attention deficit hyperactivity disorder (ADHD) 07/21/2015    Medications Jeslynn Koy had no medications administered during this visit. Current Outpatient Medications  Medication Sig Dispense Refill  . acetaminophen (TYLENOL) 325 MG tablet Take 2 tablets (650 mg total) by mouth every 4 (four) hours as needed (for pain scale < 4). 30 tablet 0  . buPROPion (WELLBUTRIN XL) 150 MG 24 hr  tablet Take 1 tablet (150 mg total) by mouth daily. 90 tablet 1  . docusate sodium (COLACE) 100 MG capsule Take 100 mg by mouth daily.    . ferrous sulfate 324 MG TBEC Take 324 mg by mouth.    Marland Kitchen ibuprofen (ADVIL) 600 MG tablet Take 1 tablet (600 mg total) by mouth every 6 (six) hours. 30 tablet 0  . Prenatal Vit-Fe Fumarate-FA (PRENATAL VITAMIN PO) Take 1 tablet by mouth daily.     No current facility-administered medications for this visit.    Allergies Patient has no known allergies.  Physical Exam:  General:  Alert, oriented and cooperative.   Mental Status: Normal mood and affect perceived. Normal judgment and thought content.  Rest of physical exam deferred due to type of encounter  PP Depression Screening:    Assessment:Patient is a 24 y.o. G1P1001 who is 2 weeks postpartum from a normal spontaneous vaginal delivery.  She is feeling overwhelmed and anxious. She has good support.   Plan:  Referral to therapist who specializes in post partum anxiety/ depression. Close follow up with me in 1 week.  Continue Wellbutrin  Will personally call [facility] and attempt a ASAP visit.    RTC 1 week   I discussed the assessment and treatment plan with the patient. The patient was provided an opportunity to ask questions and all were answered. The patient agreed with the plan and demonstrated an understanding of the instructions.   The patient was advised to call back or seek an in-person evaluation/go to the ED for  any concerning postpartum symptoms.  I provided 15 minutes of non-face-to-face time during this encounter.   Venia Carbon, NP Center for [employer], [facility]

## 2019-07-26 ENCOUNTER — Ambulatory Visit (INDEPENDENT_AMBULATORY_CARE_PROVIDER_SITE_OTHER): Payer: No Typology Code available for payment source | Admitting: Clinical

## 2019-07-26 DIAGNOSIS — F4323 Adjustment disorder with mixed anxiety and depressed mood: Secondary | ICD-10-CM

## 2019-07-26 DIAGNOSIS — O99345 Other mental disorders complicating the puerperium: Secondary | ICD-10-CM

## 2019-07-26 DIAGNOSIS — F418 Other specified anxiety disorders: Secondary | ICD-10-CM

## 2019-07-26 HISTORY — DX: Other mental disorders complicating the puerperium: O99.345

## 2019-07-26 HISTORY — DX: Other specified anxiety disorders: F41.8

## 2019-07-29 ENCOUNTER — Encounter: Payer: No Typology Code available for payment source | Admitting: Women's Health

## 2019-07-31 ENCOUNTER — Telehealth: Payer: No Typology Code available for payment source | Admitting: Family Medicine

## 2019-08-01 ENCOUNTER — Other Ambulatory Visit: Payer: Self-pay

## 2019-08-01 ENCOUNTER — Telehealth (INDEPENDENT_AMBULATORY_CARE_PROVIDER_SITE_OTHER): Payer: No Typology Code available for payment source | Admitting: Obstetrics and Gynecology

## 2019-08-01 ENCOUNTER — Telehealth: Payer: No Typology Code available for payment source | Admitting: Family Medicine

## 2019-08-01 DIAGNOSIS — F53 Postpartum depression: Secondary | ICD-10-CM

## 2019-08-01 DIAGNOSIS — O99345 Other mental disorders complicating the puerperium: Secondary | ICD-10-CM

## 2019-08-01 DIAGNOSIS — F332 Major depressive disorder, recurrent severe without psychotic features: Secondary | ICD-10-CM

## 2019-08-01 MED ORDER — BUPROPION HCL ER (XL) 150 MG PO TB24: 150.0000 mg | ORAL_TABLET | Freq: Two times a day (BID) | ORAL | 1 refills | Status: DC

## 2019-08-01 NOTE — Progress Notes (Signed)
I connected with  Hannah Duncan on 08/01/19 at  3:15 PM EDT by telephone and verified that I am speaking with the correct person using two identifiers.   I discussed the limitations, risks, security and privacy concerns of performing an evaluation and management service by telephone and the availability of in person appointments. I also discussed with the patient that there may be a patient responsible charge related to this service. The patient expressed understanding and agreed to proceed.  Ernestina Patches, CMA 08/01/2019  3:13 PM

## 2019-08-01 NOTE — Progress Notes (Signed)
     I connected with@ on 08/02/19 at  3:15 PM EDT by: Mychart video and verified that I am speaking with the correct person using two identifiers.  Patient is located at her mothers house where she is residing since baby was born,  and provider is located at [employer] for [employer] at [employer] for Women .     The purpose of this virtual visit is to provide medical care while limiting exposure to the novel coronavirus. I discussed the limitations, risks, security and privacy concerns of performing an evaluation and management service by computer, virtual visit,  and the availability of in person appointments. I also discussed with the patient that there may be a patient responsible charge related to this service. By engaging in this virtual visit, you consent to the provision of healthcare.  Additionally, you authorize for your insurance to be billed for the services provided during this visit.  The patient expressed understanding and agreed to proceed.  The following staff members participated in the virtual visit:  [name], Venia Carbon, NP  Post Partum Visit Note Subjective:   Hannah Duncan is a 24 y.o. G77P1001 female being evaluated for postpartum depression. She had an uncomplicated vaginal delivery on 5/1. She has a long history of major depressive disorder and has done well on Wellburtin 150 mg daily up until the birth of her baby. She feels sadness because she does not have the help she was hoping for from her husband. Because of this she has been staying with her parents. She finds that her mom is helpful without her having to ask for help. States FOB is easily overwhelmed with baby and can only handle baby is small increments.  She finds joy in her baby. She feels a lot of guilt staying with parents and not at home with FOB. Has high anxiety thinking about having to go back home d/t lack of support. She does have a therapy appointment scheduled next week and is highly motivated  to continue therapy weekly. D/t coronavirus she has found it difficult to schedule an appointment with a PP therapist. She has no SS thoughts or plans. She has not thoughts of harm to her baby.   The following portions of the patient's history were reviewed and updated as appropriate: allergies, current medications, past family history, past medical history, past social history, past surgical history and problem list.  Review of Systems Pertinent items are noted in HPI.   Objective:  There were no vitals filed for this visit. Self-Obtained         Assessment:   1. Severe episode of recurrent major depressive disorder, without psychotic features (HCC)  Plan:    Going to Mom support group Has therapy session scheduled next week. Plans to go weekly.  No suicidal ideations or thoughts. No thoughts of harm to baby. Has joy for baby.  Increase Wellbutrin to 150 mg BID Add Buspar 10 mg BID  Discussed with Dr. Shawnie Pons.  F/u in 2 weeks as requested by patient.     25 minutes of non-face-to-face time spent with the patient    Venia Carbon, NP Center for [employer], [facility]

## 2019-08-02 MED ORDER — BUSPIRONE HCL 10 MG PO TABS: 10.0000 mg | ORAL_TABLET | Freq: Three times a day (TID) | ORAL | 2 refills | Status: DC

## 2019-08-02 MED FILL — busPIRone HCL 10 MG TABS: 10 | 30 days supply | Qty: 90 | Fill #0

## 2019-08-15 ENCOUNTER — Telehealth: Payer: No Typology Code available for payment source | Admitting: Obstetrics and Gynecology

## 2019-09-04 MED FILL — busPIRone HCL 10 MG TABS: 10 | 30 days supply | Qty: 90 | Fill #1

## 2019-09-20 ENCOUNTER — Other Ambulatory Visit: Payer: Self-pay

## 2019-09-20 ENCOUNTER — Telehealth (INDEPENDENT_AMBULATORY_CARE_PROVIDER_SITE_OTHER): Payer: No Typology Code available for payment source | Admitting: Obstetrics and Gynecology

## 2019-09-20 ENCOUNTER — Other Ambulatory Visit: Payer: Self-pay | Admitting: Obstetrics and Gynecology

## 2019-09-20 ENCOUNTER — Encounter: Payer: Self-pay | Admitting: Obstetrics and Gynecology

## 2019-09-20 DIAGNOSIS — F331 Major depressive disorder, recurrent, moderate: Secondary | ICD-10-CM | POA: Diagnosis not present

## 2019-09-20 DIAGNOSIS — F418 Other specified anxiety disorders: Secondary | ICD-10-CM

## 2019-09-20 DIAGNOSIS — O99345 Other mental disorders complicating the puerperium: Secondary | ICD-10-CM | POA: Diagnosis not present

## 2019-09-20 MED ORDER — BUSPIRONE HCL 10 MG PO TABS: 10.0000 mg | ORAL_TABLET | Freq: Three times a day (TID) | ORAL | 2 refills | Status: DC

## 2019-09-20 MED ORDER — BUPROPION HCL ER (XL) 150 MG PO TB24: 150.0000 mg | ORAL_TABLET | Freq: Two times a day (BID) | ORAL | 1 refills | Status: DC

## 2019-09-20 MED ORDER — NORETHINDRONE 0.35 MG PO TABS
1.0000 | ORAL_TABLET | Freq: Every day | ORAL | 11 refills | Status: DC
Start: 2019-09-20 — End: 2020-02-13

## 2019-09-20 MED FILL — buPROPion HCL ER (XL) 150 M: 150 | 90 days supply | Qty: 180 | Fill #0

## 2019-09-20 MED FILL — NORETHINDRONE 0.35 MG TABS: 0.35 | 28 days supply | Qty: 28 | Fill #0

## 2019-09-20 NOTE — Progress Notes (Signed)
     I connected with Hannah Duncan on 09/20/19 at 0913 by: Mychart video and verified that I am speaking with the correct person using two identifiers.  Patient is located at home and provider is located at [facility] [employer] [facility] [employer] [facility] [facility] .     The purpose of this virtual visit is to provide medical care while limiting exposure to the novel coronavirus. I discussed the limitations, risks, security and privacy concerns of performing an evaluation and management service by virtual visit, and the availability of in person appointments. I also discussed with the patient that there may be a patient responsible charge related to this service. By engaging in this virtual visit, you consent to the provision of healthcare.  Additionally, you authorize for your insurance to be billed for the services provided during this visit.  The patient expressed understanding and agreed to proceed.  The following staff members participated in the virtual visit: Maxwell Marion, RN, Venia Carbon, NP   Post Partum Visit Note Subjective:   Hannah Duncan is a 24 y.o. G74P1001 female being evaluated for postpartum anxiety/ depression followup.  She is 10 weeks postpartum following a normal spontaneous vaginal delivery at  38w 3d.  I have fully reviewed the prenatal and intrapartum course. At her last visit with me, we increased her Wellbutrin XL to 150 BID and added buspar 10 mg TID. She is feeling significantly better. She has good coping skills and is enjoying her time with her baby. She has been doing weekly counseling at the [facility] which has helped tremendously. She was staying with her parents at their home and is now back at her home. 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  She is requesting a refill on her medication today. She has no SI thoughts or ideations.    Duane Lope, NP  09/20/2019 [facility]

## 2019-10-03 ENCOUNTER — Encounter: Payer: Self-pay | Admitting: General Practice

## 2019-11-08 ENCOUNTER — Telehealth: Payer: No Typology Code available for payment source | Admitting: Family

## 2019-11-08 DIAGNOSIS — A084 Viral intestinal infection, unspecified: Secondary | ICD-10-CM | POA: Diagnosis not present

## 2019-11-08 MED ORDER — ONDANSETRON HCL 4 MG PO TABS: 4.0000 mg | ORAL_TABLET | Freq: Three times a day (TID) | ORAL | 0 refills | Status: DC | PRN

## 2019-11-08 MED FILL — ONDANSETRON HCL 4 MG TABS: 4 | 6 days supply | Qty: 20 | Fill #0

## 2019-11-08 NOTE — Progress Notes (Signed)

## 2019-11-21 MED FILL — busPIRone HCL 10 MG TABS: 10 | 30 days supply | Qty: 90 | Fill #2

## 2019-11-28 ENCOUNTER — Ambulatory Visit: Payer: No Typology Code available for payment source | Admitting: Obstetrics and Gynecology

## 2020-02-12 ENCOUNTER — Other Ambulatory Visit: Payer: Self-pay

## 2020-02-13 ENCOUNTER — Other Ambulatory Visit: Payer: Self-pay | Admitting: Family Medicine

## 2020-02-13 ENCOUNTER — Encounter: Payer: Self-pay | Admitting: Family Medicine

## 2020-02-13 ENCOUNTER — Ambulatory Visit (INDEPENDENT_AMBULATORY_CARE_PROVIDER_SITE_OTHER): Payer: No Typology Code available for payment source | Admitting: Family Medicine

## 2020-02-13 VITALS — BP 116/66 | HR 82 | Temp 98.3°F | Ht 65.0 in | Wt 158.2 lb

## 2020-02-13 DIAGNOSIS — L700 Acne vulgaris: Secondary | ICD-10-CM | POA: Diagnosis not present

## 2020-02-13 MED ORDER — DROSPIRENONE-ETHINYL ESTRADIOL 3-0.02 MG PO TABS: 1.0000 | ORAL_TABLET | Freq: Every day | ORAL | 3 refills | Status: DC

## 2020-02-13 MED ORDER — SPIRONOLACTONE 50 MG PO TABS: 50.0000 mg | ORAL_TABLET | Freq: Every day | ORAL | 3 refills | Status: DC

## 2020-02-13 MED FILL — SPIRONOLACTONE 50 MG TABLET: 50 | 90 days supply | Qty: 90 | Fill #0

## 2020-02-13 MED FILL — JASMIEL 3-0.02 MG TABS: 3-0.02 | 84 days supply | Qty: 84 | Fill #0

## 2020-02-13 NOTE — Progress Notes (Signed)
Hannah Duncan is a 24 y.o. female  Chief Complaint  Patient presents with  . Follow-up    f/u meds.  wants to restart Yaz and Spirinolactone.  last pap 2020    HPI: Hannah Duncan is a 24 y.o. female seen today to discuss restarting spironolactone and yaz for her acne vulgaris.  She is 29mo post-partum. She is not breastfeeding. She has CPE with PAP scheduled with me in 03/2020.   Past Medical History:  Diagnosis Date  . Contusion of left knee 05/22/2017  . Depression     Past Surgical History:  Procedure Laterality Date  . WISDOM TOOTH EXTRACTION  2012    Social History   Socioeconomic History  . Marital status: Married    Spouse name: Not on file  . Number of children: Not on file  . Years of education: Not on file  . Highest education level: Not on file  Occupational History  . Not on file  Tobacco Use  . Smoking status: Never Smoker  . Smokeless tobacco: Never Used  Vaping Use  . Vaping Use: Never used  Substance and Sexual Activity  . Alcohol use: Not Currently    Comment: occasional  . Drug use: Never  . Sexual activity: Yes    Birth control/protection: None    Comment: stopped taking ocp 11/13/18  Other Topics Concern  . Not on file  Social History Narrative  . Not on file   Social Determinants of Health   Financial Resource Strain:   . Difficulty of Paying Living Expenses: Not on file  Food Insecurity: No Food Insecurity  . Worried About Programme researcher, broadcasting/film/video in the Last Year: Never true  . Ran Out of Food in the Last Year: Never true  Transportation Needs: No Transportation Needs  . Lack of Transportation (Medical): No  . Lack of Transportation (Non-Medical): No  Physical Activity:   . Days of Exercise per Week: Not on file  . Minutes of Exercise per Session: Not on file  Stress:   . Feeling of Stress : Not on file  Social Connections:   . Frequency of Communication with Friends and Family: Not on file  . Frequency of Social Gatherings with  Friends and Family: Not on file  . Attends Religious Services: Not on file  . Active Member of Clubs or Organizations: Not on file  . Attends Banker Meetings: Not on file  . Marital Status: Not on file  Intimate Partner Violence:   . Fear of Current or Ex-Partner: Not on file  . Emotionally Abused: Not on file  . Physically Abused: Not on file  . Sexually Abused: Not on file    Family History  Problem Relation Age of Onset  . Cancer Mother        melanoma  . Hypertension Mother   . Hyperlipidemia Father   . Cancer Maternal Grandmother        breast cancer  . Cancer Maternal Grandfather        skin cancer     Immunization History  Administered Date(s) Administered  . HPV Quadrivalent 03/24/2008, 06/03/2009, 09/21/2009  . Influenza Inj Mdck Quad Pf 12/05/2016, 12/01/2017  . Influenza, Seasonal, Injecte, Preservative Fre 11/27/2015  . Influenza,inj,Quad PF,6+ Mos 01/15/2019  . Influenza-Unspecified 12/11/2019  . PFIZER SARS-COV-2 Vaccination 06/14/2019, 07/05/2019, 01/06/2020  . PPD Test 07/10/2015, 07/21/2015  . Tdap 03/15/2007, 05/09/2018, 05/09/2019    Outpatient Encounter Medications as of 02/13/2020  Medication Sig  . buPROPion (  WELLBUTRIN XL) 150 MG 24 hr tablet Take 1 tablet (150 mg total) by mouth in the morning and at bedtime.  . busPIRone (BUSPAR) 10 MG tablet Take 1 tablet (10 mg total) by mouth 3 (three) times daily.  . ondansetron (ZOFRAN) 4 MG tablet Take 1 tablet (4 mg total) by mouth every 8 (eight) hours as needed for nausea or vomiting.  . ferrous sulfate 324 MG TBEC Take 324 mg by mouth. (Patient not taking: Reported on 02/13/2020)  . norethindrone (MICRONOR) 0.35 MG tablet Take 1 tablet (0.35 mg total) by mouth daily. (Patient not taking: Reported on 02/13/2020)  . Prenatal Vit-Fe Fumarate-FA (PRENATAL VITAMIN PO) Take 1 tablet by mouth daily. (Patient not taking: Reported on 02/13/2020)  . [DISCONTINUED] acetaminophen (TYLENOL) 325 MG tablet  Take 2 tablets (650 mg total) by mouth every 4 (four) hours as needed (for pain scale < 4). (Patient not taking: Reported on 09/20/2019)  . [DISCONTINUED] docusate sodium (COLACE) 100 MG capsule Take 100 mg by mouth daily. (Patient not taking: Reported on 09/20/2019)  . [DISCONTINUED] ibuprofen (ADVIL) 600 MG tablet Take 1 tablet (600 mg total) by mouth every 6 (six) hours. (Patient not taking: Reported on 09/20/2019)   No facility-administered encounter medications on file as of 02/13/2020.     ROS: Pertinent positives and negatives noted in HPI. Remainder of ROS non-contributory    No Known Allergies  BP 116/66   Pulse 82   Temp 98.3 F (36.8 C) (Temporal)   Ht 5\' 5"  (1.651 m)   Wt 158 lb 3.2 oz (71.8 kg)   SpO2 98%   BMI 26.33 kg/m   Physical Exam Constitutional:      General: She is not in acute distress.    Appearance: Normal appearance.  Pulmonary:     Effort: No respiratory distress.  Neurological:     Mental Status: She is alert and oriented to person, place, and time.  Psychiatric:        Mood and Affect: Mood normal.        Behavior: Behavior normal.      A/P:  1. Acne vulgaris Rx: - drospirenone-ethinyl estradiol (YAZ) 3-0.02 MG tablet; Take 1 tablet by mouth daily.  Dispense: 84 tablet; Refill: 3 - spironolactone (ALDACTONE) 50 MG tablet; Take 1 tablet (50 mg total) by mouth daily.  Dispense: 90 tablet; Refill: 3 - pt has been on this regimen in the past and would like to restart - f/u PRN - pt has CPE appt scheduled in 03/2020   This visit occurred during the SARS-CoV-2 public health emergency.  Safety protocols were in place, including screening questions prior to the visit, additional usage of staff PPE, and extensive cleaning of exam room while observing appropriate contact time as indicated for disinfecting solutions.

## 2020-02-14 MED FILL — busPIRone HCL 10 MG TABS: 10 | 30 days supply | Qty: 90 | Fill #0

## 2020-03-25 ENCOUNTER — Encounter: Payer: No Typology Code available for payment source | Admitting: Family Medicine

## 2020-04-08 MED FILL — buPROPion HCL ER (XL) 150 M: 150 | 90 days supply | Qty: 180 | Fill #1

## 2020-04-08 MED FILL — busPIRone HCL 10 MG TABS: 10 | 30 days supply | Qty: 90 | Fill #1

## 2020-04-09 ENCOUNTER — Other Ambulatory Visit: Payer: Self-pay

## 2020-04-10 ENCOUNTER — Other Ambulatory Visit: Payer: Self-pay | Admitting: Family Medicine

## 2020-04-10 ENCOUNTER — Ambulatory Visit (INDEPENDENT_AMBULATORY_CARE_PROVIDER_SITE_OTHER): Payer: No Typology Code available for payment source | Admitting: Family Medicine

## 2020-04-10 ENCOUNTER — Encounter: Payer: Self-pay | Admitting: Family Medicine

## 2020-04-10 VITALS — BP 114/68 | HR 78 | Temp 98.0°F | Ht 66.0 in | Wt 152.8 lb

## 2020-04-10 DIAGNOSIS — L700 Acne vulgaris: Secondary | ICD-10-CM

## 2020-04-10 DIAGNOSIS — Z0000 Encounter for general adult medical examination without abnormal findings: Secondary | ICD-10-CM | POA: Diagnosis not present

## 2020-04-10 DIAGNOSIS — Z13220 Encounter for screening for lipoid disorders: Secondary | ICD-10-CM

## 2020-04-10 DIAGNOSIS — F331 Major depressive disorder, recurrent, moderate: Secondary | ICD-10-CM

## 2020-04-10 DIAGNOSIS — Z1329 Encounter for screening for other suspected endocrine disorder: Secondary | ICD-10-CM

## 2020-04-10 MED ORDER — BUPROPION HCL ER (XL) 150 MG PO TB24: 150.0000 mg | ORAL_TABLET | Freq: Two times a day (BID) | ORAL | 3 refills | Status: DC

## 2020-04-10 MED ORDER — BUSPIRONE HCL 10 MG PO TABS: 10.0000 mg | ORAL_TABLET | Freq: Three times a day (TID) | ORAL | 3 refills | Status: DC

## 2020-04-10 NOTE — Progress Notes (Signed)
Hannah Duncan is a 25 y.o. female  Chief Complaint  Patient presents with  . Annual Exam    CPE/labs.  No concerns.      HPI: Hannah Duncan is a 25 y.o. female seen today for annual CPE, fasting labs. She is not fasting today so will either RTO for lab appt or go to [facility] at later date.   Last PAP: follows with OB-GYN   Diet/Exercise: average diet, no regular CV exercise.  Dental: UTD Vision: UTD  Med refills needed today? Yes per orders   Past Medical History:  Diagnosis Date  . Contusion of left knee 05/22/2017  . Depression     Past Surgical History:  Procedure Laterality Date  . WISDOM TOOTH EXTRACTION  2012    Social History   Socioeconomic History  . Marital status: Married    Spouse name: Not on file  . Number of children: Not on file  . Years of education: Not on file  . Highest education level: Not on file  Occupational History  . Not on file  Tobacco Use  . Smoking status: Never Smoker  . Smokeless tobacco: Never Used  Vaping Use  . Vaping Use: Never used  Substance and Sexual Activity  . Alcohol use: Not Currently    Comment: occasional  . Drug use: Never  . Sexual activity: Yes    Birth control/protection: None    Comment: stopped taking ocp 11/13/18  Other Topics Concern  . Not on file  Social History Narrative  . Not on file   Social Determinants of Health   Financial Resource Strain: Not on file  Food Insecurity: No Food Insecurity  . Worried About Programme researcher, broadcasting/film/video in the Last Year: Never true  . Ran Out of Food in the Last Year: Never true  Transportation Needs: No Transportation Needs  . Lack of Transportation (Medical): No  . Lack of Transportation (Non-Medical): No  Physical Activity: Not on file  Stress: Not on file  Social Connections: Not on file  Intimate Partner Violence: Not on file    Family History  Problem Relation Age of Onset  . Cancer Mother        melanoma  . Hypertension Mother   . Hyperlipidemia Father    . Cancer Maternal Grandmother        breast cancer  . Cancer Maternal Grandfather        skin cancer     Immunization History  Administered Date(s) Administered  . HPV Quadrivalent 03/24/2008, 06/03/2009, 09/21/2009  . Influenza Inj Mdck Quad Pf 12/05/2016, 12/01/2017  . Influenza, Seasonal, Injecte, Preservative Fre 11/27/2015  . Influenza,inj,Quad PF,6+ Mos 01/15/2019  . Influenza-Unspecified 12/11/2019  . PFIZER(Purple Top)SARS-COV-2 Vaccination 06/14/2019, 07/05/2019, 01/06/2020  . PPD Test 07/10/2015, 07/21/2015  . Tdap 03/15/2007, 05/09/2018, 05/09/2019    Outpatient Encounter Medications as of 04/10/2020  Medication Sig  . drospirenone-ethinyl estradiol (YAZ) 3-0.02 MG tablet Take 1 tablet by mouth daily.  Marland Kitchen spironolactone (ALDACTONE) 50 MG tablet Take 1 tablet (50 mg total) by mouth daily.  . [DISCONTINUED] buPROPion (WELLBUTRIN XL) 150 MG 24 hr tablet Take 1 tablet (150 mg total) by mouth in the morning and at bedtime.  . [DISCONTINUED] busPIRone (BUSPAR) 10 MG tablet Take 1 tablet (10 mg total) by mouth 3 (three) times daily.  Marland Kitchen buPROPion (WELLBUTRIN XL) 150 MG 24 hr tablet Take 1 tablet (150 mg total) by mouth in the morning and at bedtime.  . busPIRone (BUSPAR) 10 MG  tablet Take 1 tablet (10 mg total) by mouth 3 (three) times daily.   No facility-administered encounter medications on file as of 04/10/2020.     ROS: Gen: no fever, chills  Skin: no rash, itching ENT: no ear pain, ear drainage, nasal congestion, rhinorrhea, sinus pressure, sore throat Eyes: no blurry vision, double vision Resp: no cough, wheeze,SOB Breast: no breast tenderness, no nipple discharge, no breast masses CV: no CP, palpitations, LE edema,  GI: no heartburn, n/v/d/c, abd pain GU: no dysuria, urgency, frequency, hematuria MSK: no joint pain, myalgias, back pain Neuro: no dizziness, headache, weakness, vertigo Psych: + anxiety and depression - controlled on meds   No Known  Allergies  BP 114/68   Pulse 78   Temp 98 F (36.7 C) (Temporal)   Ht 5\' 6"  (1.676 m)   Wt 152 lb 12.8 oz (69.3 kg)   SpO2 98%   BMI 24.66 kg/m   Wt Readings from Last 3 Encounters:  04/10/20 152 lb 12.8 oz (69.3 kg)  02/13/20 158 lb 3.2 oz (71.8 kg)  07/12/19 169 lb (76.7 kg)   Temp Readings from Last 3 Encounters:  04/10/20 98 F (36.7 C) (Temporal)  02/13/20 98.3 F (36.8 C) (Temporal)  07/14/19 97.8 F (36.6 C) (Oral)   BP Readings from Last 3 Encounters:  04/10/20 114/68  02/13/20 116/66  07/14/19 (!) 105/59   Pulse Readings from Last 3 Encounters:  04/10/20 78  02/13/20 82  07/14/19 95     Physical Exam Constitutional:      General: She is not in acute distress.    Appearance: Normal appearance. She is well-developed and well-nourished.  HENT:     Right Ear: Tympanic membrane and ear canal normal.     Left Ear: Tympanic membrane and ear canal normal.     Nose: Nose normal.     Mouth/Throat:     Mouth: Oropharynx is clear and moist and mucous membranes are normal.  Eyes:     Conjunctiva/sclera: Conjunctivae normal.  Neck:     Thyroid: No thyromegaly.  Cardiovascular:     Rate and Rhythm: Normal rate and regular rhythm.     Pulses: Intact distal pulses.     Heart sounds: Normal heart sounds. No murmur heard.   Pulmonary:     Effort: Pulmonary effort is normal. No respiratory distress.     Breath sounds: Normal breath sounds. No wheezing or rhonchi.  Abdominal:     General: Bowel sounds are normal. There is no distension.     Palpations: Abdomen is soft. There is no mass.     Tenderness: There is no abdominal tenderness.  Musculoskeletal:        General: No edema.     Cervical back: Neck supple.     Right lower leg: No edema.     Left lower leg: No edema.  Lymphadenopathy:     Cervical: No cervical adenopathy.  Skin:    General: Skin is warm and dry.  Neurological:     Mental Status: She is alert and oriented to person, place, and time.      Motor: No abnormal muscle tone.     Coordination: Coordination normal.  Psychiatric:        Mood and Affect: Mood and affect normal.        Behavior: Behavior normal.      A/P:   1. Annual physical exam - discussed importance of regular CV exercise, healthy diet, adequate sleep - UTD on dental and  vision - due for PAP and will schedule with GYN - immunizations UTD - ALT; Future - AST; Future - Basic metabolic panel; Future - CBC; Future - next CPE in 1 year  2. Moderate episode of recurrent major depressive disorder (HCC) - well-controlled on current meds Refill: - busPIRone (BUSPAR) 10 MG tablet; Take 1 tablet (10 mg total) by mouth 3 (three) times daily.  Dispense: 270 tablet; Refill: 3 - buPROPion (WELLBUTRIN XL) 150 MG 24 hr tablet; Take 1 tablet (150 mg total) by mouth in the morning and at bedtime.  Dispense: 180 tablet; Refill: 3  3. Acne vulgaris - stable, cont spironolactone and OCP  4. Screening for lipid disorders - Lipid panel; Future  5. Screening for thyroid disorder - TSH; Future      This visit occurred during the SARS-CoV-2 public health emergency.  Safety protocols were in place, including screening questions prior to the visit, additional usage of staff PPE, and extensive cleaning of exam room while observing appropriate contact time as indicated for disinfecting solutions.

## 2020-05-11 DIAGNOSIS — F064 Anxiety disorder due to known physiological condition: Secondary | ICD-10-CM | POA: Diagnosis not present

## 2020-05-11 MED FILL — SPIRONOLACTONE 50 MG TABLET: 50 | 90 days supply | Qty: 90 | Fill #1

## 2020-05-11 MED FILL — JASMIEL 3-0.02 MG TABS: 3-0.02 | 84 days supply | Qty: 84 | Fill #1

## 2020-06-05 MED FILL — busPIRone HCL 10 MG TABS: 10 | 90 days supply | Qty: 270 | Fill #0

## 2020-08-06 ENCOUNTER — Other Ambulatory Visit (HOSPITAL_COMMUNITY): Payer: Self-pay

## 2020-08-06 MED FILL — Bupropion HCl Tab ER 24HR 150 MG: ORAL | 90 days supply | Qty: 180 | Fill #0 | Status: AC

## 2020-08-06 MED FILL — Drospirenone-Ethinyl Estradiol Tab 3-0.02 MG: ORAL | 84 days supply | Qty: 84 | Fill #0 | Status: AC

## 2020-09-24 ENCOUNTER — Telehealth: Payer: No Typology Code available for payment source | Admitting: Physician Assistant

## 2020-09-24 ENCOUNTER — Encounter: Payer: Self-pay | Admitting: Physician Assistant

## 2020-09-24 ENCOUNTER — Other Ambulatory Visit (HOSPITAL_BASED_OUTPATIENT_CLINIC_OR_DEPARTMENT_OTHER): Payer: Self-pay

## 2020-09-24 DIAGNOSIS — R21 Rash and other nonspecific skin eruption: Secondary | ICD-10-CM

## 2020-09-24 DIAGNOSIS — W5503XA Scratched by cat, initial encounter: Secondary | ICD-10-CM

## 2020-09-24 MED ORDER — AMOXICILLIN-POT CLAVULANATE 875-125 MG PO TABS
1.0000 | ORAL_TABLET | Freq: Two times a day (BID) | ORAL | 0 refills | Status: DC
  Filled 2020-09-24: qty 14, 7d supply, fill #0

## 2020-09-24 NOTE — Progress Notes (Signed)
Ms. Hannah Duncan, Hannah Duncan are scheduled for a virtual visit with your provider today.    Just as we do with appointments in the office, we must obtain your consent to participate.  Your consent will be active for this visit and any virtual visit you may have with one of our providers in the next 365 days.    If you have a MyChart account, I can also send a copy of this consent to you electronically.  All virtual visits are billed to your insurance company just like a traditional visit in the office.  As this is a virtual visit, video technology does not allow for your provider to perform a traditional examination.  This may limit your provider's ability to fully assess your condition.  If your provider identifies any concerns that need to be evaluated in person or the need to arrange testing such as labs, EKG, etc, we will make arrangements to do so.    Although advances in technology are sophisticated, we cannot ensure that it will always work on either your end or our end.  If the connection with a video visit is poor, we may have to switch to a telephone visit.  With either a video or telephone visit, we are not always able to ensure that we have a secure connection.   I need to obtain your verbal consent now.   Are you willing to proceed with your visit today?   Hannah Duncan has provided verbal consent on 09/24/2020 for a virtual visit (video or telephone).   Hannah Loveless, PA-C 09/24/2020  3:36 PM  Virtual Visit Consent   Hannah Duncan, you are scheduled for a virtual visit with a [place] provider today.     Just as with appointments in the office, your consent must be obtained to participate.  Your consent will be active for this visit and any virtual visit you may have with one of our providers in the next 365 days.     If you have a MyChart account, a copy of this consent can be sent to you electronically.  All virtual visits are billed to your insurance company just like a traditional  visit in the office.    As this is a virtual visit, video technology does not allow for your provider to perform a traditional examination.  This may limit your provider's ability to fully assess your condition.  If your provider identifies any concerns that need to be evaluated in person or the need to arrange testing (such as labs, EKG, etc.), we will make arrangements to do so.     Although advances in technology are sophisticated, we cannot ensure that it will always work on either your end or our end.  If the connection with a video visit is poor, the visit may have to be switched to a telephone visit.  With either a video or telephone visit, we are not always able to ensure that we have a secure connection.     I need to obtain your verbal consent now.   Are you willing to proceed with your visit today?    Hannah Duncan has provided verbal consent on 09/24/2020 for a virtual visit (video or telephone).   Hannah Loveless, PA-C   Date: 09/24/2020 3:36 PM   Virtual Visit via Video Note   IMargaretann Duncan, connected with  Hannah Duncan  ([id number], 1995/10/27) on 09/24/20 at  3:45 PM EDT by a video-enabled telemedicine application and verified that I  am speaking with the correct person using two identifiers.  Location: Patient: Virtual Visit Location Patient: Home Provider: Virtual Visit Location Provider: Home Office   I discussed the limitations of evaluation and management by telemedicine and the availability of in person appointments. The patient expressed understanding and agreed to proceed.    History of Present Illness: Hannah Duncan is a 25 y.o. who identifies as a female who was assigned female at birth, and is being seen today for possible infected cat scratch. She reports 2 days ago she was cutting her blind cats nails and had a deep scratch. She has been cleaning daily with soap and water. Now has redness, swelling and a purulent discharge from the wound. Denies fevers,  chills, nausea, or vomiting.   Problems:  Patient Active Problem List   Diagnosis Date Noted   Postpartum anxiety 07/26/2019   Normal labor 07/12/2019   Full-term premature rupture of membranes    Anemia in pregnancy 05/13/2019   Supervision of low-risk pregnancy 12/27/2018   Moderate episode of recurrent major depressive disorder (HCC) 12/27/2016   Pes cavus 06/07/2016   Attention deficit hyperactivity disorder (ADHD) 07/21/2015    Allergies: No Known Allergies Medications:  Current Outpatient Medications:    amoxicillin-clavulanate (AUGMENTIN) 875-125 MG tablet, Take 1 tablet by mouth 2 (two) times daily., Disp: 14 tablet, Rfl: 0   buPROPion (WELLBUTRIN XL) 150 MG 24 hr tablet, TAKE 1 TABLET BY MOUTH IN THE MORNING AND AT BEDTIME, Disp: 180 tablet, Rfl: 3   busPIRone (BUSPAR) 10 MG tablet, TAKE 1 TABLET BY MOUTH 3 TIMES DAILY, Disp: 270 tablet, Rfl: 3   drospirenone-ethinyl estradiol (YAZ) 3-0.02 MG tablet, TAKE 1 TABLET BY MOUTH ONCE A DAY, Disp: 84 tablet, Rfl: 3   spironolactone (ALDACTONE) 50 MG tablet, TAKE 1 TABLET BY MOUTH ONCE A DAY, Disp: 90 tablet, Rfl: 3  Observations/Objective: Patient is well-developed, well-nourished in no acute distress.  Resting comfortably at home.  Head is normocephalic, atraumatic.  No labored breathing. Speech is clear and coherent with logical content.  Patient is alert and oriented at baseline.    Assessment and Plan: 1. Cat scratch - amoxicillin-clavulanate (AUGMENTIN) 875-125 MG tablet; Take 1 tablet by mouth 2 (two) times daily.  Dispense: 14 tablet; Refill: 0 - Augmentin provided as noted above for infection from cat scratch - Continue to clean with soap and water twice daily - Seek in person evaluation if systemic symptoms, such as fevers, chills, nausea, or vomiting develop.  Follow Up Instructions: I discussed the assessment and treatment plan with the patient. The patient was provided an opportunity to ask questions and all  were answered. The patient agreed with the plan and demonstrated an understanding of the instructions.  A copy of instructions were sent to the patient via MyChart.  The patient was advised to call back or seek an in-person evaluation if the symptoms worsen or if the condition fails to improve as anticipated.  Time:  I spent 8 minutes with the patient via telehealth technology discussing the above problems/concerns.    Hannah Loveless, PA-C

## 2020-09-24 NOTE — Patient Instructions (Signed)
Cat-Scratch Disease, Adult Cat-scratch disease is a bacterial infection that is caused by a bite or scratch from an infected cat. It is sometimes called cat-scratch fever. The infection can cause a red bump, swollen glands, and flu-like symptoms. The infection does not spread (is not contagious) between humans. In most cases, the infection is mild and it clears up without treatment. However, a more severe infection may develop in people who have other illnesses or problems that weaken the body's disease-fighting system (immune system). What are the causes? This condition is caused by bacteria called Bartonella henselae. These bacteria may be present in the mouth or on the claws of cats or kittens, especially those that are younger than 77 year old. Cats do not look oract sick when they have this infection. The bacteria can spread to humans through: A bite or scratch. Having contact with an infected cat and then touching your eyes or mouth. What increases the risk? You are more likely to develop this condition if: You own or interact with a cat. You have a weakened immune system. Your immune system may be weakened if: You are pregnant. You have certain conditions like cancer, HIV, or AIDS. You received a donated organ (transplant). What are the signs or symptoms? The first symptoms may be: A bite or scratch that does not heal. A red bump near the wound. The bump may be red, warm, and tender to the touch. Other symptoms may take a few weeks to develop. They may include: Swollen, tender glands (lymph nodes) in the neck or under the arms. Fever. Rash. Joint pain. Headache. Low energy. Poor appetite. How is this diagnosed? This condition is diagnosed based on your symptoms and your history of a scratch or bite from a cat. Your health care provider will examine your skin and look for swollen lymph nodes. You may have tests, such as: A culture test. This involves testing a sample of fluid or pus  from your wound. Blood tests. A lymph node biopsy. This involves removing and testing a tissue sample from a swollen lymph node. How is this treated? Cat-scratch disease usually goes away without treatment in 2-4 months. Your health care provider may recommend home care, such as using a warm cloth (compress) and over-the-counter pain medicine. You may be prescribed antibiotic medicine if: Your immune system is weakened. Your symptoms cause discomfort. If you have a painful, swollen lymph node, it may need to be drained with aneedle. (This is rare.) Follow these instructions at home: Rest and return to your normal activities as told by your health care provider. Take over-the-counter and prescription medicines only as told by your health care provider. If you were prescribed an antibiotic medicine, take it as told by your health care provider. Do not stop taking the antibiotic even if you start to feel better. Apply warm compresses to the area as told your health care provider. Check your wound or swollen gland areas every day for signs of infection, such as: Redness, swelling, or pain. Fluid or blood. Warmth. Pus or a bad smell. Keep all follow-up visits as told by your health care provider. This is important. How is this prevented? Avoid being scratched or bitten while playing with cats. To do this, avoid playing roughly or taking food away from a cat. Wash your hands well after you have contact with a cat. If you get scratched or bitten, wash the injured area as soon as possible with warm water and soap. Do not let a cat  lick your skin if you have any cuts, sores, or scratches. Do not pick up or play with stray cats. If you have an indoor cat, do not let your cat outside. Having contact with stray cats may make your cat more likely to have contact with the bacteria. Contact a health care provider if: You have redness, swelling, or pain around your wound. You have fluid, blood, or pus  coming from your wound. Your wound is warm to the touch. There is pus or a bad smell coming from your wound. You have a fever. Your symptoms get worse or do not get better at home. Get help right away if: You have more swelling of your lymph nodes in your neck or under your arms. You develop pain in your abdomen. You develop a skin rash. You feel dizzy or you faint. You develop inflammation of your eye or vision problems. You have pain in your bones. You develop a stiff neck. Summary Cat-scratch disease, sometimes called cat-scratch fever, is a bacterial infection that is caused by a bite or scratch from an infected cat. The infection can cause a red bump, swollen glands, and flu-like symptoms. Bacteria that cause this condition may be present in the mouth or on the claws of cats or kittens, especially those that are younger than 25 year old. If you were prescribed an antibiotic medicine, take it as told by your health care provider. Do not stop taking the antibiotic even if you start to feel better. This information is not intended to replace advice given to you by your health care provider. Make sure you discuss any questions you have with your healthcare provider. Document Revised: 11/01/2019 Document Reviewed: 11/01/2019 Elsevier Patient Education  2022 [employer].

## 2020-10-13 MED FILL — Spironolactone Tab 50 MG: ORAL | 90 days supply | Qty: 90 | Fill #0 | Status: AC

## 2020-10-14 ENCOUNTER — Other Ambulatory Visit (HOSPITAL_COMMUNITY): Payer: Self-pay

## 2020-10-16 ENCOUNTER — Other Ambulatory Visit: Payer: Self-pay

## 2020-10-16 ENCOUNTER — Encounter (HOSPITAL_BASED_OUTPATIENT_CLINIC_OR_DEPARTMENT_OTHER): Payer: Self-pay | Admitting: Nurse Practitioner

## 2020-10-16 ENCOUNTER — Ambulatory Visit (INDEPENDENT_AMBULATORY_CARE_PROVIDER_SITE_OTHER): Payer: No Typology Code available for payment source | Admitting: Nurse Practitioner

## 2020-10-16 ENCOUNTER — Telehealth (HOSPITAL_BASED_OUTPATIENT_CLINIC_OR_DEPARTMENT_OTHER): Payer: Self-pay | Admitting: Nurse Practitioner

## 2020-10-16 VITALS — BP 116/83 | HR 87 | Ht 65.0 in | Wt 154.4 lb

## 2020-10-16 DIAGNOSIS — F331 Major depressive disorder, recurrent, moderate: Secondary | ICD-10-CM

## 2020-10-16 DIAGNOSIS — Z13220 Encounter for screening for lipoid disorders: Secondary | ICD-10-CM

## 2020-10-16 DIAGNOSIS — Z139 Encounter for screening, unspecified: Secondary | ICD-10-CM

## 2020-10-16 DIAGNOSIS — Z13228 Encounter for screening for other metabolic disorders: Secondary | ICD-10-CM

## 2020-10-16 DIAGNOSIS — F909 Attention-deficit hyperactivity disorder, unspecified type: Secondary | ICD-10-CM

## 2020-10-16 DIAGNOSIS — Z130 Encounter for screening for diseases of the blood and blood-forming organs and certain disorders involving the immune mechanism: Secondary | ICD-10-CM

## 2020-10-16 DIAGNOSIS — Z1329 Encounter for screening for other suspected endocrine disorder: Secondary | ICD-10-CM

## 2020-10-16 DIAGNOSIS — Z1321 Encounter for screening for nutritional disorder: Secondary | ICD-10-CM

## 2020-10-16 NOTE — Progress Notes (Signed)
Shawna Clamp, DNP, AGNP-c Primary Care Services ______________________________________________________________________  HPI Hannah Duncan is a 25 y.o. female presenting to [facility] today to establish care.   Patient Care Team: Kylah Maresh, Sung Amabile, NP as PCP - General (Nurse Practitioner)  Health Maintenance  Topic Date Due   Hepatitis C Screening: USPSTF Recommendation to screen - Ages 33-79 yo.  Never done   Pap Smear  09/22/2019   Pap Smear  09/22/2019   Flu Shot  10/12/2020   Tetanus Vaccine  05/08/2029   HPV Vaccine  Completed   COVID-19 Vaccine  Completed   HIV Screening  Completed   Pneumococcal Vaccination  Aged Out     Concerns today: Establish Care 25 y/o married female with one child. Works as Chief Executive Officer for [employer] No health concerns today- establishing care  UTD on  Vaccinations Pap CPE Labs need drawn today   Patient Active Problem List   Diagnosis Date Noted   Postpartum anxiety 07/26/2019   Full-term premature rupture of membranes    Moderate episode of recurrent major depressive disorder (HCC) 12/27/2016   Attention deficit hyperactivity disorder (ADHD) 07/21/2015    PHQ9 Today: Depression screen [facility] 2/9 10/16/2020 02/13/2020 08/01/2019  Decreased Interest 1 0 2  Down, Depressed, Hopeless 0 0 2  PHQ - 2 Score 1 0 4  Altered sleeping 0 0 1  Tired, decreased energy 0 0 1  Change in appetite 0 0 3  Feeling bad or failure about yourself  0 0 1  Trouble concentrating 0 0 2  Moving slowly or fidgety/restless 0 0 0  Suicidal thoughts 0 0 0  PHQ-9 Score 1 0 12  Difficult doing work/chores Not difficult at all Not difficult at all -  Some recent data might be hidden   GAD7 Today: GAD 7 : Generalized Anxiety Score 08/01/2019 07/19/2019 07/09/2019 07/04/2019  Nervous, Anxious, on Edge 1 3 0 0  Control/stop worrying 1 3 0 0  Worry too much - different things 1 3 0 0  Trouble relaxing 2 3 0 0  Restless  1 3 0 0  Easily annoyed or irritable 1 1 0 0  Afraid - awful might happen 1 2 0 0  Total GAD 7 Score 8 18 0 0  Anxiety Difficulty Not difficult at all - - -   ______________________________________________________________________ PMH Past Medical History:  Diagnosis Date   Contusion of left knee 05/22/2017   Depression     ROS All review of systems negative except what is listed in the HPI  PHYSICAL EXAM Physical Exam Vitals and nursing note reviewed.  Constitutional:      Appearance: Normal appearance. She is normal weight.  HENT:     Head: Normocephalic.  Eyes:     Extraocular Movements: Extraocular movements intact.     Conjunctiva/sclera: Conjunctivae normal.     Pupils: Pupils are equal, round, and reactive to light.  Cardiovascular:     Rate and Rhythm: Normal rate and regular rhythm.     Pulses: Normal pulses.     Heart sounds: Normal heart sounds.  Pulmonary:     Effort: Pulmonary effort is normal.     Breath sounds: Normal breath sounds.  Abdominal:     General: Abdomen is flat. Bowel sounds are normal.     Palpations: Abdomen is soft.  Musculoskeletal:        General: Normal range of motion.     Cervical back: Normal range of motion.  No tenderness.     Right lower leg: No edema.     Left lower leg: No edema.  Lymphadenopathy:     Cervical: No cervical adenopathy.  Skin:    General: Skin is warm and dry.     Capillary Refill: Capillary refill takes less than 2 seconds.  Neurological:     General: No focal deficit present.     Mental Status: She is alert and oriented to person, place, and time.  Psychiatric:        Mood and Affect: Mood normal.        Behavior: Behavior normal.        Thought Content: Thought content normal.        Judgment: Judgment normal.   ______________________________________________________________________ ASSESSMENT AND PLAN Problem List Items Addressed This Visit     Attention deficit hyperactivity disorder (ADHD)    Moderate episode of recurrent major depressive disorder (HCC)   Other Visit Diagnoses     Encounter for health-related screening    -  Primary   Relevant Orders   Comprehensive metabolic panel   CBC with Differential/Platelet   TSH   Ambulatory referral to Optometry   Screening for endocrine, nutritional, metabolic and immunity disorder       Relevant Orders   Comprehensive metabolic panel   TSH   Screening for lipid disorders           Education provided today during visit and on AVS for patient to review at home.  Diet and Exercise recommendations provided.  Current diagnoses and recommendations discussed. HM recommendations reviewed with recommendations.    Outpatient Encounter Medications as of 10/16/2020  Medication Sig   buPROPion (WELLBUTRIN XL) 150 MG 24 hr tablet TAKE 1 TABLET BY MOUTH IN THE MORNING AND AT BEDTIME   busPIRone (BUSPAR) 10 MG tablet TAKE 1 TABLET BY MOUTH 3 TIMES DAILY (Patient taking differently: Take 20 mg by mouth daily.)   drospirenone-ethinyl estradiol (YAZ) 3-0.02 MG tablet TAKE 1 TABLET BY MOUTH ONCE A DAY   spironolactone (ALDACTONE) 50 MG tablet TAKE 1 TABLET BY MOUTH ONCE A DAY   [DISCONTINUED] amoxicillin-clavulanate (AUGMENTIN) 875-125 MG tablet Take 1 tablet by mouth 2 (two) times daily.   No facility-administered encounter medications on file as of 10/16/2020.    Return for About February for CPE and labs or sooner if needed.  Time: 30 minutes, >50% spent counseling, care coordination, chart review, and documentation.   Tollie Eth, DNP, AGNP-c

## 2020-10-16 NOTE — Telephone Encounter (Signed)
LVMTCB Re: Making appt =  Return for About February for CPE and labs or sooner if needed

## 2020-10-16 NOTE — Assessment & Plan Note (Signed)
Stable- no stimulant medications at this time If symptoms worsen and you feel medication changes need to be made, please let me know

## 2020-10-16 NOTE — Assessment & Plan Note (Signed)
Well controlled on current medications No red flags today No changes at this time Recommend f/u if symptoms worsen or you feel changes need to be made to your medications.

## 2020-10-16 NOTE — Patient Instructions (Addendum)
Recommendations from today's visit: If you need any refills please let me know.  We will let you know what your labs show and if we have any concerns. Everything looks good today!  Information on diet, exercise, and health maintenance recommendations are listed below. This is information to help you be sure you are on track for optimal health and monitoring.   Please look over this and let us know if you have any questions or if you have completed any of the health maintenance outside of [place] so that we can be sure your records are up to date.  ___________________________________________________________  Thank you for choosing [facility] for your Primary Care needs. I am excited for the opportunity to partner with you to meet your health care goals. It was a pleasure meeting you today!  I am an Adult-Geriatric Nurse Practitioner with a background in caring for patients for more than 20 years. I provide primary care and sports medicine services to patients age 8 and older within this office. I am also the director of the APP Fellowship with [facility].   I am passionate about providing the best service to you through preventive medicine and supportive care. I consider you a part of the medical team and value your input. I work diligently to ensure that you are heard and your needs are met in a safe and effective manner. I want you to feel comfortable with me as your provider and want you to know that your health concerns are important to me.  For your information, our office hours are Monday- Friday 8:00 AM - 5:00 PM At this time I am not in the office on Wednesdays.  If you have questions or concerns, please call our office at [phone] or send [place] a MyChart message and we will respond as quickly as possible.   For all urgent or time sensitive needs we ask that you please call the office to avoid delays. MyChart is not constantly monitored and  replies may take up to 72 business hours.  MyChart Policy: MyChart allows for you to see your visit notes, after visit summary, provider recommendations, lab and tests results, make an appointment, request refills, and contact your provider or the office for non-urgent questions or concerns. Providers are seeing patients during normal business hours and do not have built in time to review MyChart messages.  We ask that you allow a minimum of 4 business days for responses to [employer]. For this reason, please do not send urgent requests through [place]. Please call the office at [phone]. Complex MyChart concerns may require a visit. Your provider may request you schedule a virtual or in person visit to ensure we are providing the best care possible. MyChart messages sent after 4:00 PM on Friday will not be received by the provider until Monday morning.    Lab and Test Results: You will receive your lab and test results on MyChart as soon as they are completed and results have been sent by the lab or testing facility. Due to this service, you will receive your results BEFORE your provider.  I review lab and tests results each morning prior to seeing patients. Some results require collaboration with other providers to ensure you are receiving the most appropriate care. For this reason, we ask that you please allow a minimum of 4 business days for your provider to receive and review lab and test results and contact you about these.  Most  lab and test result comments from the provider will be sent through [place]. Your provider may recommend changes to the plan of care, follow-up visits, repeat testing, ask questions, or request an office visit to discuss these results. You may reply directly to this message or call the office at [phone] to provide information for the provider or set up an appointment. In some instances, you will be called with test results and recommendations. Please let us  know if this is preferred and we will make note of this in your chart to provide this for you.    If you have not heard a response to your lab or test results in 72 business hours, please call the office to let us know.   After Hours: For all non-emergency after hours needs, please call the office at [phone] and select the option to reach the on-call provider service. On-call services are shared between multiple [place] offices and therefore it will not be possible to speak directly with your provider. On-call providers may provide medical advice and recommendations, but are unable to provide refills for maintenance medications.  For all emergency or urgent medical needs after normal business hours, we recommend that you seek care at the closest Urgent Care or Emergency Department to ensure appropriate treatment in a timely manner.  [facility] [place] has a 24 hour emergency room located on the ground floor for your convenience.    Please do not hesitate to reach out to [place] with concerns.   Thank you, again, for choosing me as your health care partner. I appreciate your trust and look forward to learning more about you.   [name], DNP, AGNP-c ___________________________________________________________  Health Maintenance Recommendations Screening Testing Mammogram Every 1 -2 years based on history and risk factors Starting at age 5 Pap Smear Ages 21-39 every 3 years Ages 44-65 every 5 years with HPV testing More frequent testing may be required based on results and history Colon Cancer Screening Every 1-10 years based on test performed, risk factors, and history Starting at age 25 Bone Density Screening Every 2-10 years based on history Starting at age 51 for women Recommendations for men differ based on medication usage, history, and risk factors AAA Screening One time ultrasound Men 86-3 years old who have every smoked Lung Cancer Screening Low  Dose Lung CT every 12 months Age 64-80 years with a 30 pack-year smoking history who still smoke or who have quit within the last 15 years  Screening Labs Routine  Labs: Complete Blood Count (CBC), Complete Metabolic Panel (CMP), Cholesterol (Lipid Panel) Every 6-12 months based on history and medications May be recommended more frequently based on current conditions or previous results Hemoglobin A1c Lab Every 3-12 months based on history and previous results Starting at age 47 or earlier with diagnosis of diabetes, high cholesterol, BMI >26, and/or risk factors Frequent monitoring for patients with diabetes to ensure blood sugar control Thyroid Panel (TSH w/ T3 & T4) Every 6 months based on history, symptoms, and risk factors May be repeated more often if on medication HIV One time testing for all patients 71 and older May be repeated more frequently for patients with increased risk factors or exposure Hepatitis C One time testing for all patients 45 and older May be repeated more frequently for patients with increased risk factors or exposure Gonorrhea, Chlamydia Every 12 months for all sexually active persons 13-24 years Additional monitoring may be recommended for those who are considered high risk  or who have symptoms PSA Men 38-75 years old with risk factors Additional screening may be recommended from age 100-69 based on risk factors, symptoms, and history  Vaccine Recommendations Tetanus Booster All adults every 10 years Flu Vaccine All patients 6 months and older every year COVID Vaccine All patients 12 years and older Initial dosing with booster May recommend additional booster based on age and health history HPV Vaccine 2 doses all patients age 30-26 Dosing may be considered for patients over 26 Shingles Vaccine (Shingrix) 2 doses all adults 49 years and older Pneumonia (Pneumovax 23) All adults 63 years and older May recommend earlier dosing based on health  history Pneumonia (Prevnar 59) All adults 10 years and older Dosed 1 year after Pneumovax 23  Additional Screening, Testing, and Vaccinations may be recommended on an individualized basis based on family history, health history, risk factors, and/or exposure.  __________________________________________________________  Diet Recommendations for All Patients  I recommend that all patients maintain a diet low in saturated fats, carbohydrates, and cholesterol. While this can be challenging at first, it is not impossible and small changes can make big differences.  Things to try: Decreasing the amount of soda, sweet tea, and/or juice to one or less per day and replace with water While water is always the first choice, if you do not like water you may consider adding a water additive without sugar to improve the taste other sugar free drinks Replace potatoes with a brightly colored vegetable at dinner Use healthy oils, such as canola oil or olive oil, instead of butter or hard margarine Limit your bread intake to two pieces or less a day Replace regular pasta with low carb pasta options Bake, broil, or grill foods instead of frying Monitor portion sizes  Eat smaller, more frequent meals throughout the day instead of large meals  An important thing to remember is, if you love foods that are not great for your health, you don't have to give them up completely. Instead, allow these foods to be a reward when you have done well. Allowing yourself to still have special treats every once in a while is a nice way to tell yourself thank you for working hard to keep yourself healthy.   Also remember that every day is a new day. If you have a bad day and "fall off the wagon", you can still climb right back up and keep moving along on your journey!  We have resources available to help you!  Some websites that may be helpful  include: www.http://carter.biz/  Www.VeryWellFit.com _____________________________________________________________  Activity Recommendations for All Patients  I recommend that all adults get at least 20 minutes of moderate physical activity that elevates your heart rate at least 5 days out of the week.  Some examples include: Walking or jogging at a pace that allows you to carry on a conversation Cycling (stationary bike or outdoors) Water aerobics Yoga Weight lifting Dancing If physical limitations prevent you from putting stress on your joints, exercise in a pool or seated in a chair are excellent options.  Do determine your MAXIMUM heart rate for activity: YOUR AGE - 220 = MAX HeartRate   Remember! Do not push yourself too hard.  Start slowly and build up your pace, speed, weight, time in exercise, etc.  Allow your body to rest between exercise and get good sleep. You will need more water than normal when you are exerting yourself. Do not wait until you are thirsty to drink. Drink with a  purpose of getting in at least 8, 8 ounce glasses of water a day plus more depending on how much you exercise and sweat.    If you begin to develop dizziness, chest pain, abdominal pain, jaw pain, shortness of breath, headache, vision changes, lightheadedness, or other concerning symptoms, stop the activity and allow your body to rest. If your symptoms are severe, seek emergency evaluation immediately. If your symptoms are concerning, but not severe, please let us know so that we can recommend further evaluation.   ________________________________________________________________

## 2020-10-17 LAB — CBC WITH DIFFERENTIAL/PLATELET
Basophils Absolute: 0.1 10*3/uL (ref 0.0–0.2)
Basos: 1 %
EOS (ABSOLUTE): 0.1 10*3/uL (ref 0.0–0.4)
Eos: 1 %
Hematocrit: 38.6 % (ref 34.0–46.6)
Hemoglobin: 12.5 g/dL (ref 11.1–15.9)
Immature Grans (Abs): 0 10*3/uL (ref 0.0–0.1)
Immature Granulocytes: 0 %
Lymphocytes Absolute: 2.4 10*3/uL (ref 0.7–3.1)
Lymphs: 27 %
MCH: 27.4 pg (ref 26.6–33.0)
MCHC: 32.4 g/dL (ref 31.5–35.7)
MCV: 85 fL (ref 79–97)
Monocytes Absolute: 0.7 10*3/uL (ref 0.1–0.9)
Monocytes: 8 %
Neutrophils Absolute: 5.5 10*3/uL (ref 1.4–7.0)
Neutrophils: 63 %
Platelets: 313 10*3/uL (ref 150–450)
RBC: 4.57 x10E6/uL (ref 3.77–5.28)
RDW: 13.2 % (ref 11.7–15.4)
WBC: 8.7 10*3/uL (ref 3.4–10.8)

## 2020-10-17 LAB — COMPREHENSIVE METABOLIC PANEL
ALT: 12 IU/L (ref 0–32)
AST: 15 IU/L (ref 0–40)
Albumin/Globulin Ratio: 1.6 (ref 1.2–2.2)
Albumin: 4.7 g/dL (ref 3.9–5.0)
Alkaline Phosphatase: 78 IU/L (ref 44–121)
BUN/Creatinine Ratio: 14 (ref 9–23)
BUN: 11 mg/dL (ref 6–20)
Bilirubin Total: <0.2 mg/dL (ref 0.0–1.2)
CO2: 23 mmol/L (ref 20–29)
Calcium: 9.3 mg/dL (ref 8.7–10.2)
Chloride: 101 mmol/L (ref 96–106)
Creatinine, Ser: 0.8 mg/dL (ref 0.57–1.00)
Globulin, Total: 2.9 g/dL (ref 1.5–4.5)
Glucose: 84 mg/dL (ref 65–99)
Potassium: 4 mmol/L (ref 3.5–5.2)
Sodium: 138 mmol/L (ref 134–144)
Total Protein: 7.6 g/dL (ref 6.0–8.5)
eGFR: 105 mL/min/{1.73_m2} (ref >59)

## 2020-10-17 LAB — TSH: TSH: 2.48 u[IU]/mL (ref 0.450–4.500)

## 2020-10-20 NOTE — Progress Notes (Signed)
Labs normal- no changes

## 2020-11-02 MED FILL — Drospirenone-Ethinyl Estradiol Tab 3-0.02 MG: ORAL | 84 days supply | Qty: 84 | Fill #1 | Status: AC

## 2020-11-03 ENCOUNTER — Other Ambulatory Visit (HOSPITAL_COMMUNITY): Payer: Self-pay

## 2020-11-17 MED FILL — Buspirone HCl Tab 10 MG: ORAL | 90 days supply | Qty: 270 | Fill #0 | Status: AC

## 2020-11-18 ENCOUNTER — Other Ambulatory Visit (HOSPITAL_COMMUNITY): Payer: Self-pay

## 2020-11-29 IMAGING — US US MFM OB DETAIL+14 WK
1 series · 13 of 28 positions shown · non-contrast
Comparison: none

[Series 1: us mfm ob detail+14 wk · 13 of 131 slices shown]
[im 5/131]
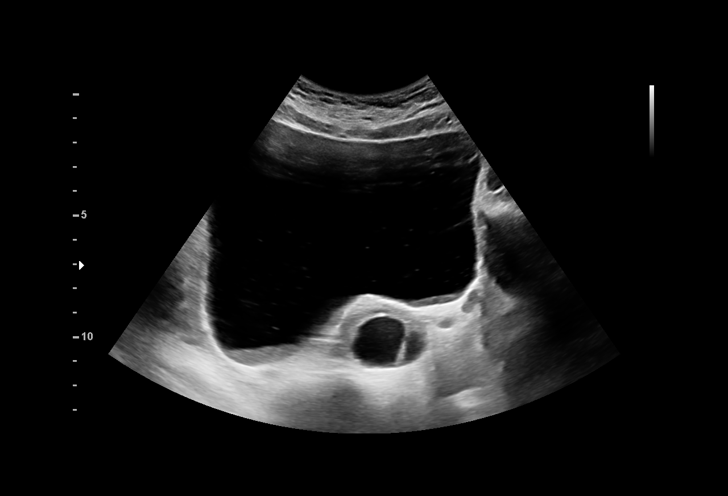
[im 15/131]
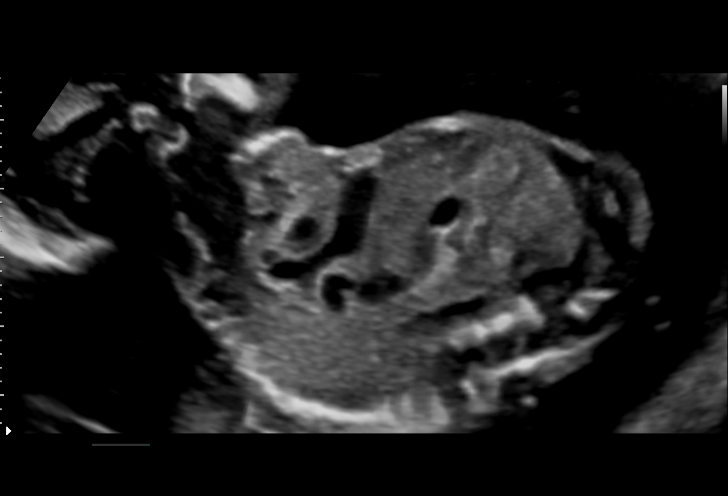
[im 25/131]
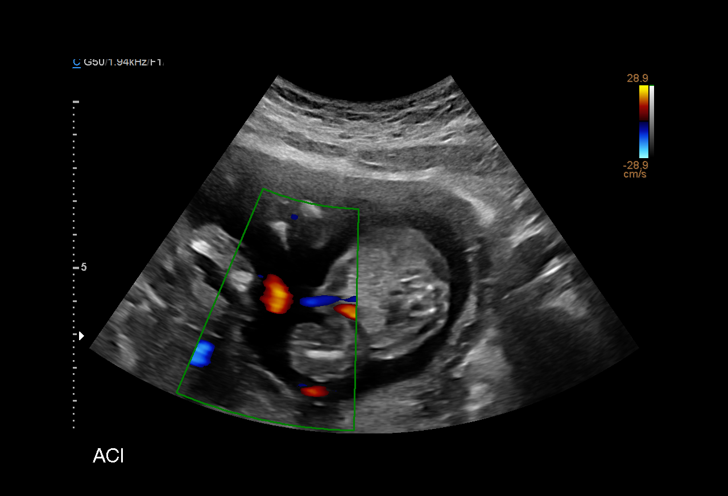
[im 34/131]
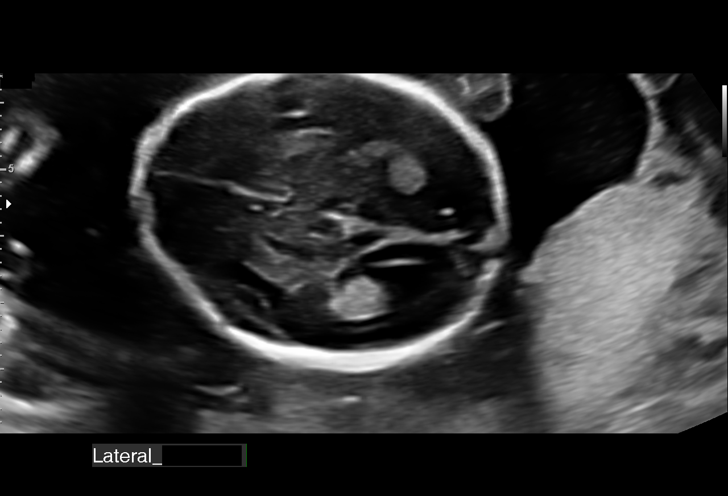
[im 44/131]
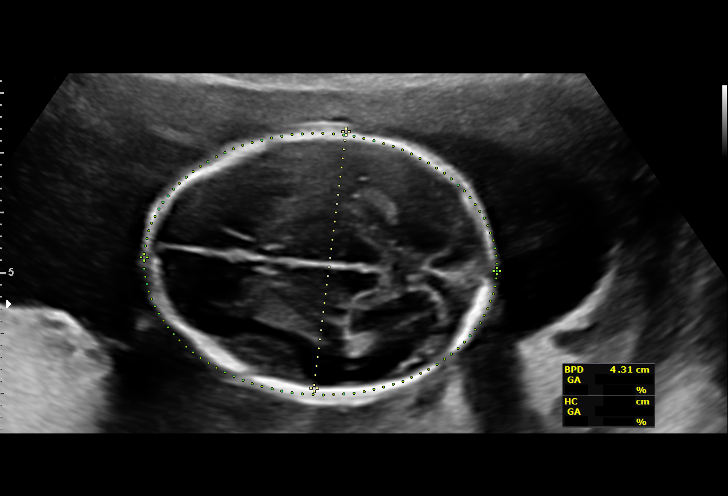
[im 53/131]
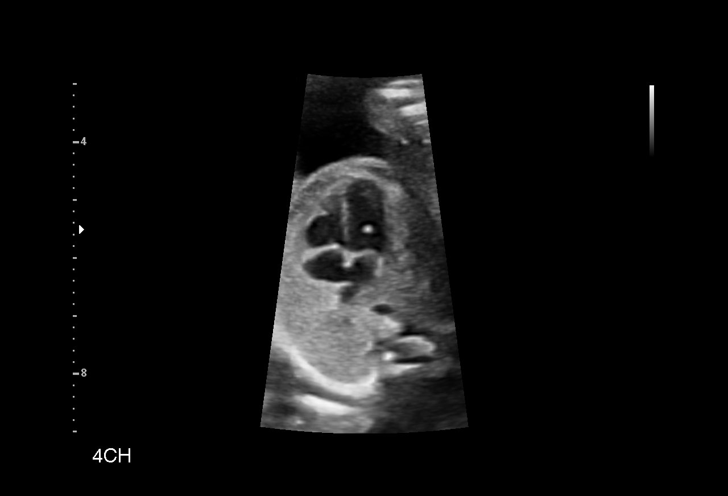
[im 68/131]
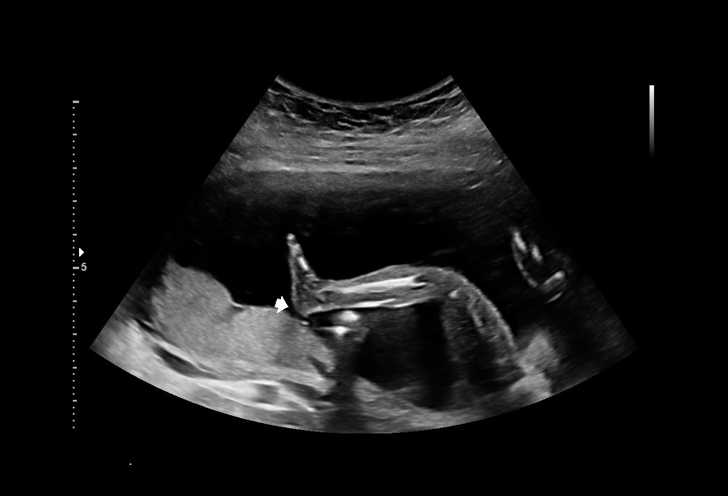
[im 78/131]
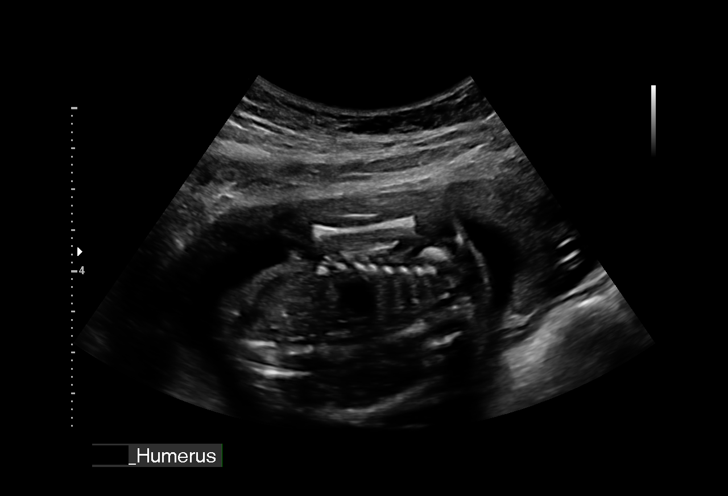
[im 87/131]
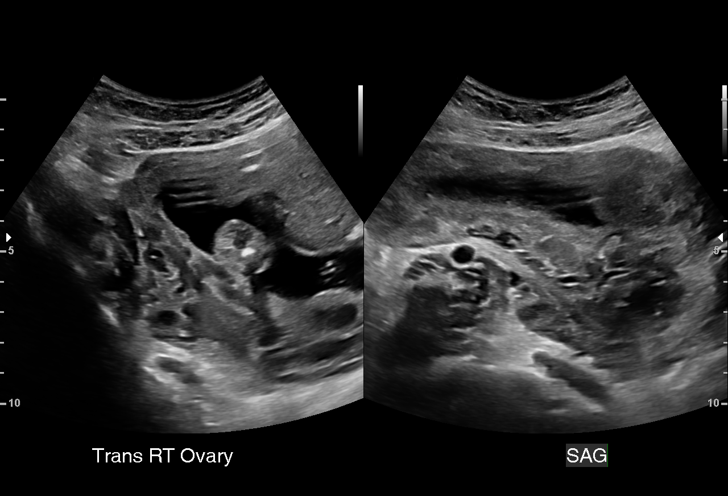
[im 97/131]
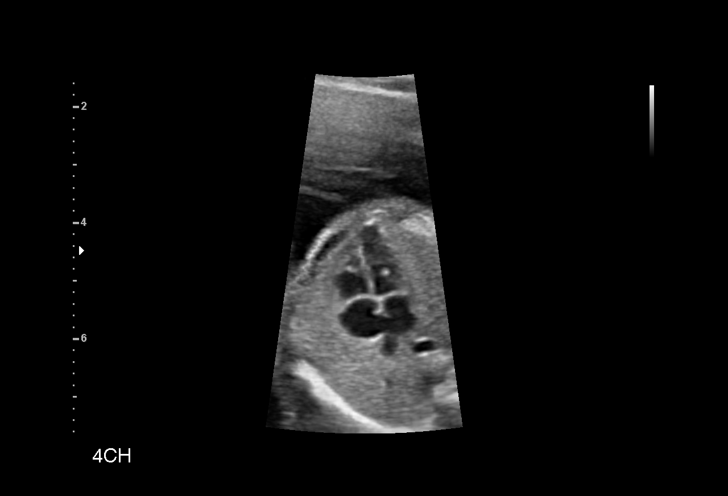
[im 106/131]
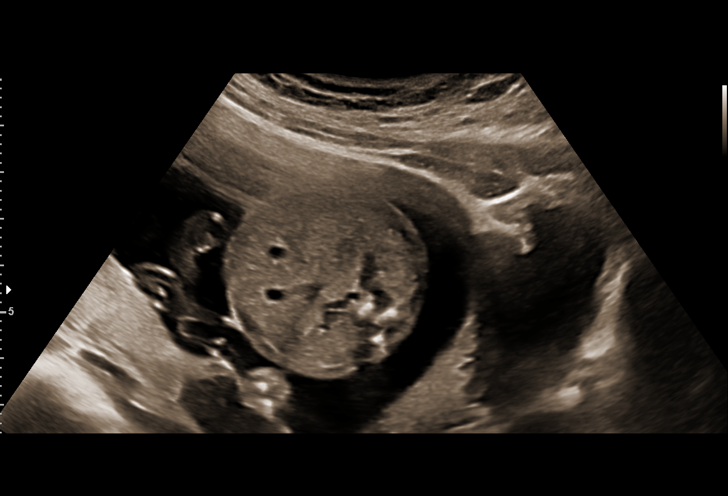
[im 116/131]
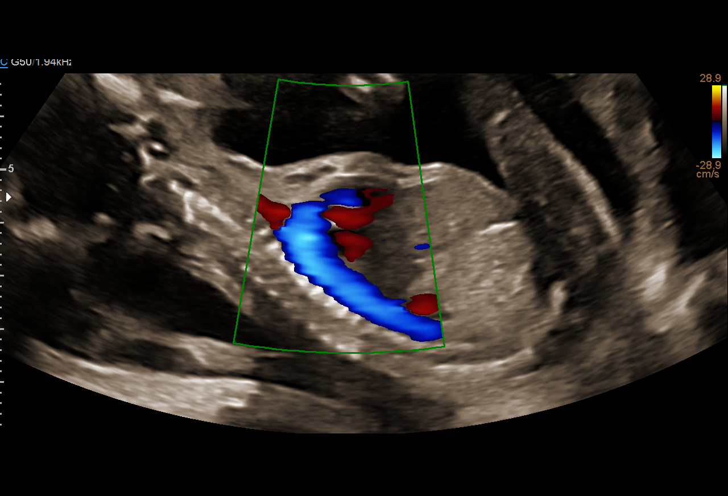
[im 126/131]
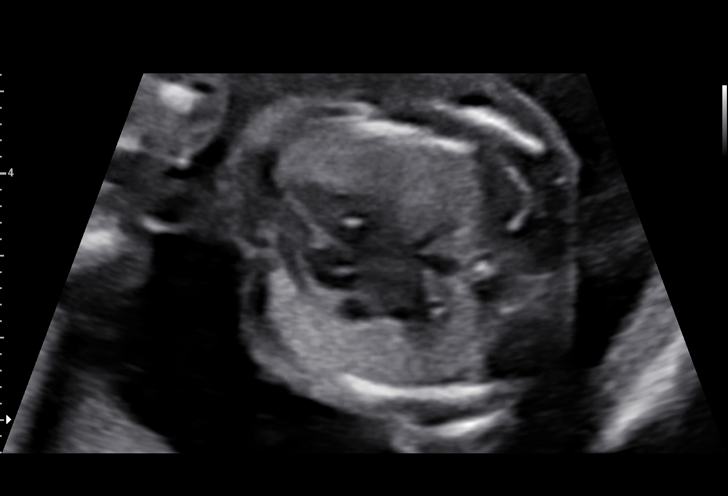

[13 of 28 positions shown; findings below may reference images not displayed]

FAZIDA NP

 ----------------------------------------------------------------------

 ----------------------------------------------------------------------
Indications

  Fetal abnormality - other known or
  suspected (EIFLV)
  Encounter for antenatal screening for
  malformations
  18 weeks gestation of pregnancy
 ----------------------------------------------------------------------
Fetal Evaluation

 Num Of Fetuses:         1
 Fetal Heart Rate(bpm):  148
 Cardiac Activity:       Observed
 Presentation:           Variable
 Placenta:               Posterior
 P. Cord Insertion:      Visualized

 Amniotic Fluid
 AFI FV:      Within normal limits

                             Largest Pocket(cm)

Biometry

 BPD:      42.7  mm     G. Age:  18w 6d         60  %    CI:        70.08   %    70 - 86
                                                         FL/HC:      16.8   %    16.1 -
 HC:      162.7  mm     G. Age:  19w 0d         59  %    HC/AC:      1.11        1.09 -
 AC:      147.1  mm     G. Age:  20w 0d         85  %    FL/BPD:     63.9   %
 FL:       27.3  mm     G. Age:  18w 2d         30  %    FL/AC:      18.6   %    20 - 24
 HUM:      25.5  mm     G. Age:  18w 0d         32  %
 CER:      19.5  mm     G. Age:  18w 5d         51  %
 NFT:       2.8  mm
 LV:        6.7  mm
 CM:        4.4  mm
 Est. FW:     279  gm    0 lb 10 oz      74  %
OB History

 Gravidity:    1
Gestational Age

 LMP:           18w 5d        Date:  10/17/18                 EDD:   07/24/19
 U/S Today:     19w 0d                                        EDD:   07/22/19
 Best:          18w 5d     Det. By:  LMP  (10/17/18)          EDD:   07/24/19
Anatomy

 Cranium:               Appears normal         Aortic Arch:            Appears normal
 Ventricles:            Appears normal         Ductal Arch:            Appears normal
 Choroid Plexus:        Appears normal         Diaphragm:              Appears normal
 Cerebellum:            Appears normal         Stomach:                Appears normal, left
                                                                       sided
 Posterior Fossa:       Appears normal         Abdomen:                Appears normal
 Nuchal Fold:           Appears normal         Abdominal Wall:         Appears nml (cord
                                                                       insert, abd wall)
 Face:                  Appears normal         Cord Vessels:           Appears normal (3
                        (orbits and profile)                           vessel cord)
 Lips:                  Appears normal         Kidneys:                Appear normal
 Palate:                Not well visualized    Bladder:                Appears normal
 Thoracic:              Appears normal         Spine:                  Not well visualized
 Heart:                 Echogenic focus        Upper Extremities:      LUE appears nl;
                        in LV
                                                                       RUE visualized
 RVOT:                  Appears normal         Lower Extremities:      Appears normal
 LVOT:                  Appears normal

 Other:  Fetus appears to be female. Heels and LT 5th digit visualized. Nasal
         bone visualized. Technically difficult due to fetal position.
Cervix Uterus Adnexa

 Cervix
 Length:            3.8  cm.
 Normal appearance by transabdominal scan.

 Uterus
 No abnormality visualized.

 Left Ovary
 Size(cm)       1.7  x   1.2    x  0.9       Vol(ml):
 Within normal limits.

 Right Ovary
 Size(cm)       1.9  x   1.4    x  1.2       Vol(ml):
 Within normal limits.

 Comment
 Nabothian cysts seen TA when bladder was full.
Comments

 This patient was seen for a detailed fetal anatomy scan. She
 denies any significant past medical history and denies any
 problems in her current pregnancy.
 She had a cell free DNA test earlier in her pregnancy which
 indicated a low risk for trisomy 21, 18, and 13. A female fetus
 is predicted.
 She was informed that the fetal growth and amniotic fluid
 level were appropriate for her gestational age.
 On today's exam, an intracardiac echogenic focus was noted
 in the left ventricle of the fetal heart.  The small association
 between an echogenic focus and Down syndrome was
 discussed. Due to the echogenic focus noted today, the
 patient was offered and declined an amniocentesis today for
 definitive diagnosis of fetal aneuploidy.  She reports that she
 is comfortable with her negative cell free DNA test.
 The views of the fetal anatomy were limited today due to the
 fetal position.
 The patient was informed that anomalies may be missed due
 to technical limitations. If the fetus is in a suboptimal position
 or maternal habitus is increased, visualization of the fetus in
 the maternal uterus may be impaired.
 A follow up exam was schduled in 4 weeks to obtain better
 views of the fetal anatomy.

## 2020-12-14 MED FILL — Bupropion HCl Tab ER 24HR 150 MG: ORAL | 90 days supply | Qty: 180 | Fill #1 | Status: AC

## 2020-12-15 ENCOUNTER — Other Ambulatory Visit (HOSPITAL_COMMUNITY): Payer: Self-pay

## 2020-12-28 IMAGING — US US MFM OB FOLLOW-UP
1 series · 13 of 28 positions shown · non-contrast
Comparison: none

[Series 1: us mfm ob follow-up · 13 of 109 slices shown]
[im 5/109]
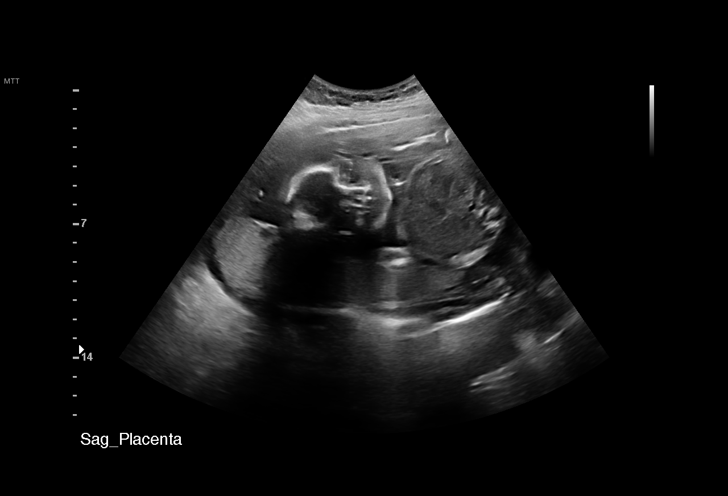
[im 13/109]
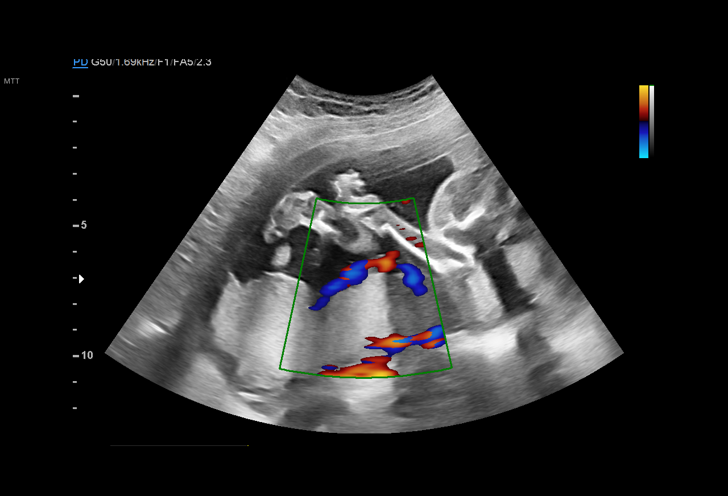
[im 21/109]
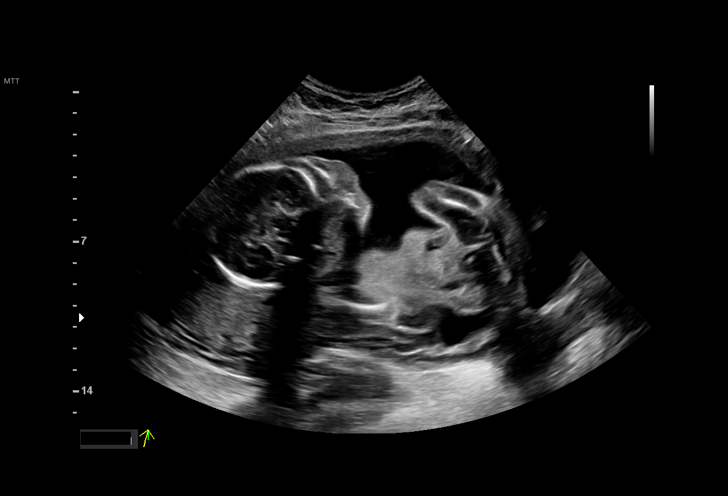
[im 29/109]
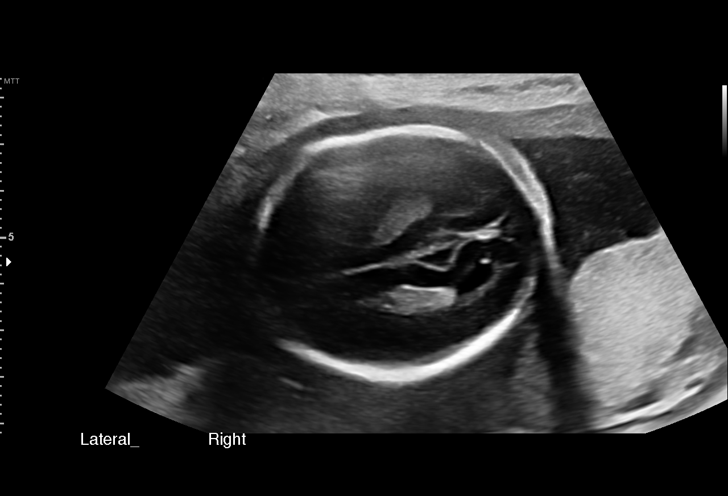
[im 37/109]
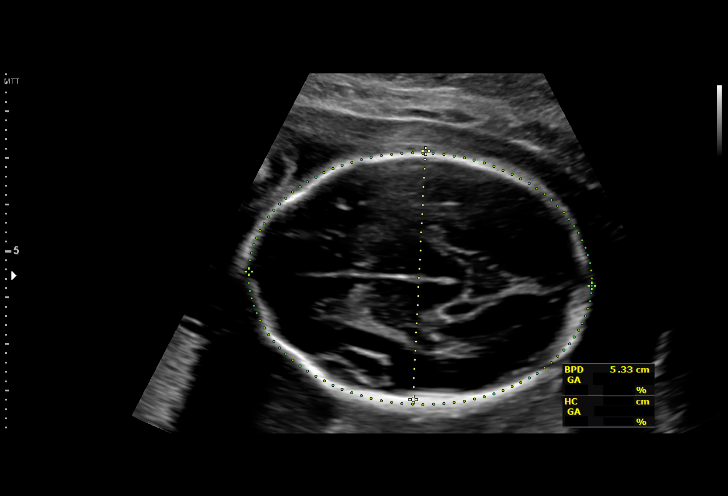
[im 45/109]
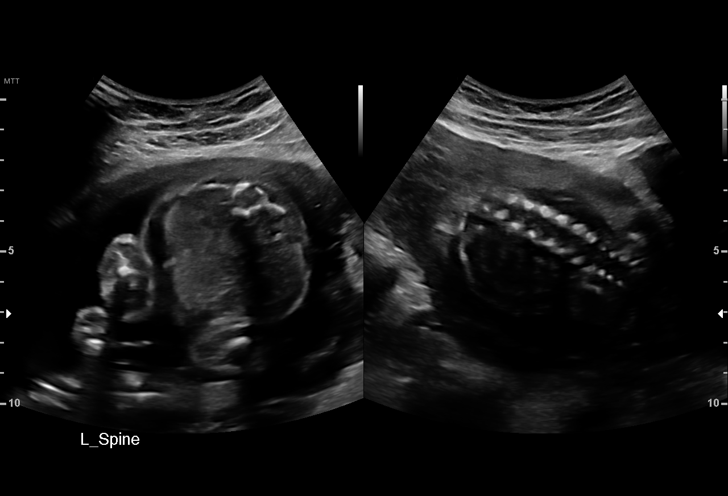
[im 57/109]
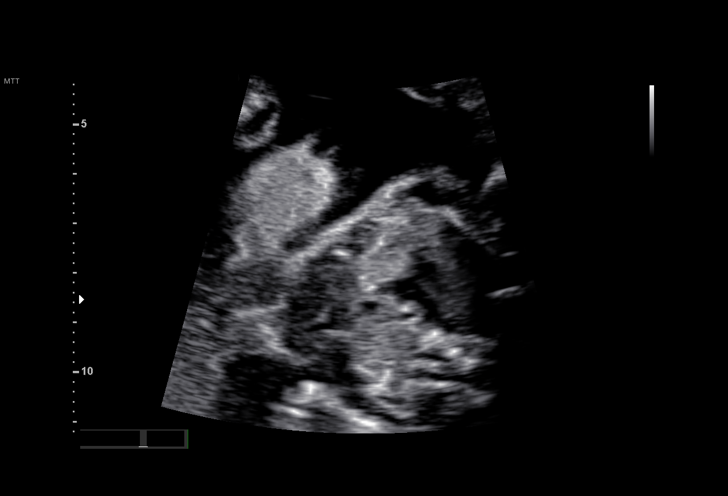
[im 65/109]
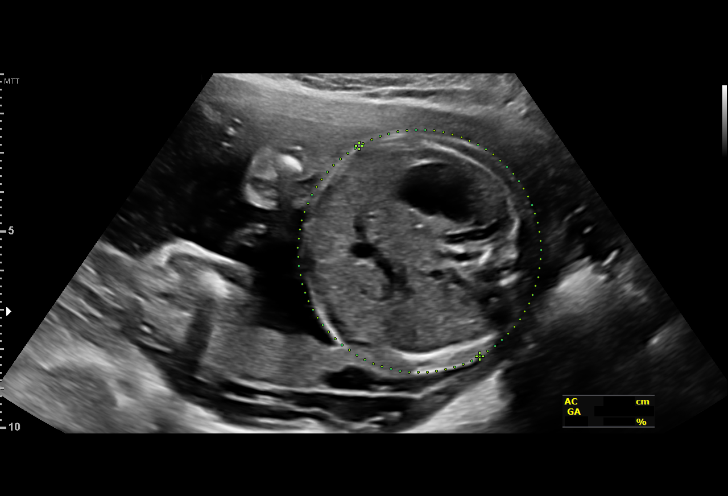
[im 73/109]
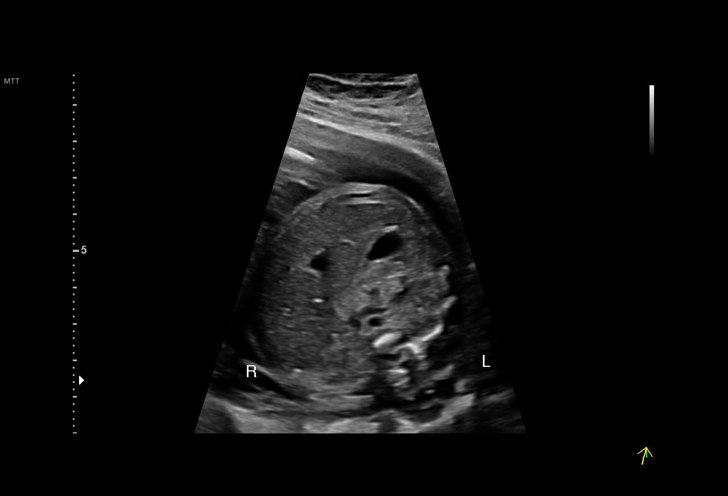
[im 81/109]
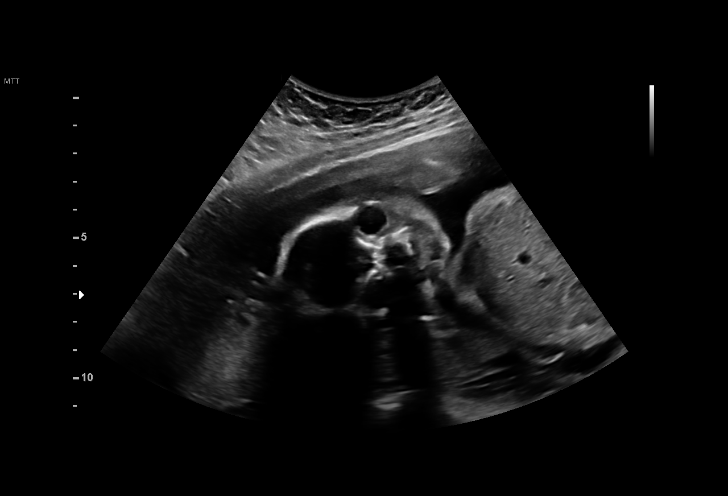
[im 89/109]
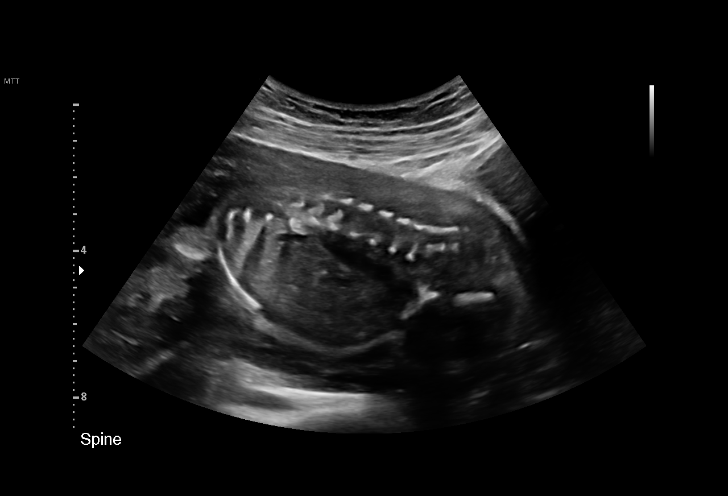
[im 97/109]
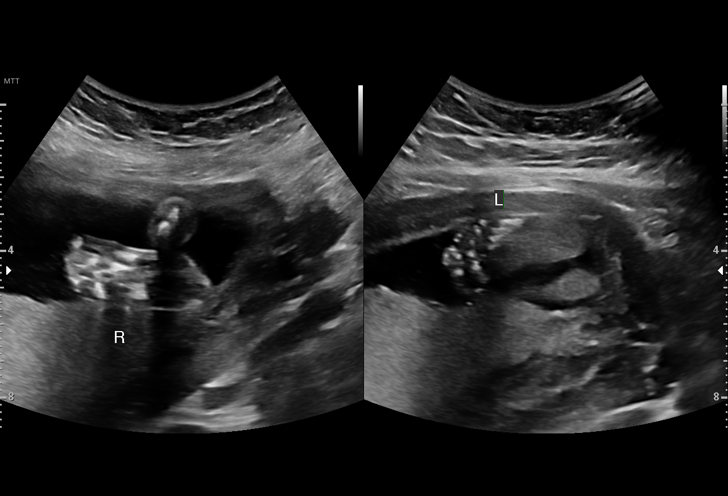
[im 105/109]
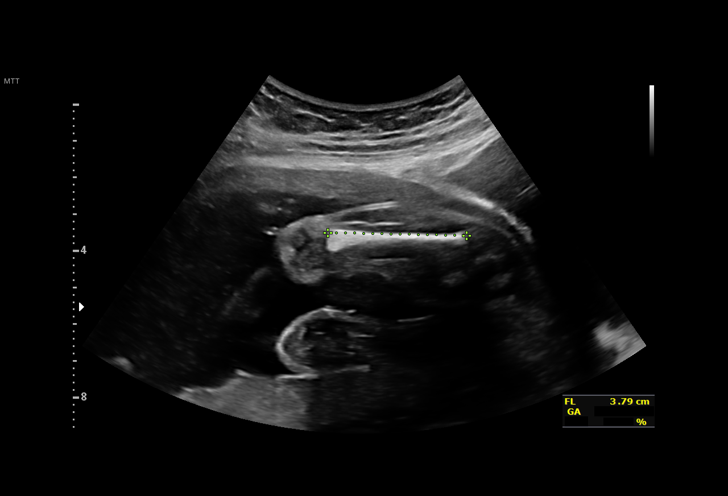

[13 of 28 positions shown; findings below may reference images not displayed]

[address]
                   PROPHETT NP                                 [HOSPITAL]

 ----------------------------------------------------------------------

 ----------------------------------------------------------------------
Indications

  22 weeks gestation of pregnancy
  Fetal abnormality - other known or
  suspected (EIFLV)
  Encounter for antenatal screening for
  malformations
  Antenatal follow-up for nonvisualized fetal
  anatomy
 ----------------------------------------------------------------------
Fetal Evaluation

 Num Of Fetuses:         1
 Fetal Heart Rate(bpm):  158
 Cardiac Activity:       Observed
 Presentation:           Variable
 Placenta:               Posterior
 P. Cord Insertion:      Previously Visualized

 Amniotic Fluid
 AFI FV:      Within normal limits

                             Largest Pocket(cm)

Biometry

 BPD:      52.5  mm     G. Age:  22w 0d         15  %    CI:        69.63   %    70 - 86
                                                         FL/HC:      18.9   %    19.2 -
 HC:      200.8  mm     G. Age:  22w 2d         15  %    HC/AC:      1.01        1.05 -
 AC:       199   mm     G. Age:  24w 4d         89  %    FL/BPD:     72.2   %    71 - 87
 FL:       37.9  mm     G. Age:  22w 1d         17  %    FL/AC:      19.0   %    20 - 24
 HUM:      35.8  mm     G. Age:  22w 3d         34  %
 CER:      25.5  mm     G. Age:  23w 4d         60  %
 LV:        3.7  mm
 CM:        5.3  mm

 Est. FW:     581  gm      1 lb 4 oz     65  %
OB History

 Gravidity:    1
Gestational Age

 LMP:           22w 6d        Date:  10/17/18                 EDD:   07/24/19
 U/S Today:     22w 5d                                        EDD:   07/25/19
 Best:          22w 6d     Det. By:  LMP  (10/17/18)          EDD:   07/24/19
Anatomy

 Cranium:               Appears normal         LVOT:                   Appears normal
 Cavum:                 Appears normal         Aortic Arch:            Appears normal
 Ventricles:            Appears normal         Ductal Arch:            Appears normal
 Choroid Plexus:        Appears normal         Diaphragm:              Appears normal
 Cerebellum:            Appears normal         Stomach:                Appears normal, left
                                                                       sided
 Posterior Fossa:       Appears normal         Abdomen:                Previously seen
 Nuchal Fold:           Previously seen        Abdominal Wall:         Appears nml (cord
                                                                       insert, abd wall)
 Face:                  Appears normal         Cord Vessels:           Appears normal (3
                        (orbits and profile)                           vessel cord)
 Lips:                  Appears normal         Kidneys:                Previously seen
 Palate:                Not well visualized    Bladder:                Previously seen
 Thoracic:              Previously seen        Spine:                  Limited views
                                                                       appear normal
 Heart:                 Echogenic focus        Upper Extremities:      Appears normal
                        in LV
 RVOT:                  Appears normal         Lower Extremities:      Appears normal

 Other:  Female gender previously seen. Heels and 5th digit visualized. Nasal
         bone previously seen. Technically difficult due to fetal position.
Cervix Uterus Adnexa

 Cervix
 Length:           3.38  cm.
 Normal appearance by transabdominal scan.

 Uterus
 No abnormality visualized.

 Left Ovary
 Not visualized. No adnexal mass visualized.

 Right Ovary
 Not visualized. No adnexal mass visualized.

 Cul De Sac
 No free fluid seen.

 Adnexa
 No abnormality visualized.
Impression

 Patient returned for completion of fetal anatomy. Fetal growth
 is appropriate for gestational age. Amniotic fluid is normal
 and good fetal activity is seen. Fetal anatomical survey was
 completed and appears normal.
Recommendations

 Follow-up scans as clinically indicated.
                 Chow, Yadira

## 2021-01-28 ENCOUNTER — Encounter (HOSPITAL_BASED_OUTPATIENT_CLINIC_OR_DEPARTMENT_OTHER): Payer: Self-pay | Admitting: Nurse Practitioner

## 2021-01-28 DIAGNOSIS — L700 Acne vulgaris: Secondary | ICD-10-CM

## 2021-01-28 MED FILL — Buspirone HCl Tab 10 MG: ORAL | 90 days supply | Qty: 270 | Fill #1 | Status: AC

## 2021-01-28 MED FILL — Bupropion HCl Tab ER 24HR 150 MG: ORAL | 90 days supply | Qty: 180 | Fill #2 | Status: CN

## 2021-01-28 MED FILL — Spironolactone Tab 50 MG: ORAL | 90 days supply | Qty: 90 | Fill #1 | Status: CN

## 2021-01-29 ENCOUNTER — Other Ambulatory Visit (HOSPITAL_COMMUNITY): Payer: Self-pay

## 2021-01-29 MED ORDER — SPIRONOLACTONE 50 MG PO TABS
ORAL_TABLET | Freq: Every day | ORAL | 0 refills | Status: DC
  Filled 2021-01-29: qty 90, 90d supply, fill #0

## 2021-01-29 MED ORDER — DROSPIRENONE-ETHINYL ESTRADIOL 3-0.02 MG PO TABS
1.0000 | ORAL_TABLET | Freq: Every day | ORAL | 1 refills | Status: DC
  Filled 2021-01-29: qty 84, 84d supply, fill #0
  Filled 2021-03-31 – 2021-04-20 (×2): qty 84, 84d supply, fill #1

## 2021-03-31 MED FILL — Bupropion HCl Tab ER 24HR 150 MG: ORAL | 90 days supply | Qty: 180 | Fill #2 | Status: AC

## 2021-03-31 MED FILL — Buspirone HCl Tab 10 MG: ORAL | 90 days supply | Qty: 270 | Fill #2 | Status: CN

## 2021-04-01 ENCOUNTER — Other Ambulatory Visit (HOSPITAL_COMMUNITY): Payer: Self-pay

## 2021-04-05 ENCOUNTER — Other Ambulatory Visit (HOSPITAL_COMMUNITY): Payer: Self-pay

## 2021-04-06 ENCOUNTER — Other Ambulatory Visit (HOSPITAL_COMMUNITY): Payer: Self-pay

## 2021-04-12 ENCOUNTER — Other Ambulatory Visit: Payer: Self-pay | Admitting: Family Medicine

## 2021-04-12 ENCOUNTER — Other Ambulatory Visit (HOSPITAL_COMMUNITY): Payer: Self-pay

## 2021-04-12 DIAGNOSIS — F331 Major depressive disorder, recurrent, moderate: Secondary | ICD-10-CM

## 2021-04-13 MED ORDER — BUSPIRONE HCL 10 MG PO TABS
ORAL_TABLET | Freq: Three times a day (TID) | ORAL | 3 refills | Status: DC
  Filled 2021-04-13 – 2021-09-07 (×2): qty 270, 90d supply, fill #0
  Filled 2021-12-04: qty 270, 90d supply, fill #1
  Filled 2022-03-15: qty 270, 90d supply, fill #2

## 2021-04-14 ENCOUNTER — Other Ambulatory Visit (HOSPITAL_COMMUNITY): Payer: Self-pay

## 2021-04-15 ENCOUNTER — Other Ambulatory Visit (HOSPITAL_COMMUNITY): Payer: Self-pay

## 2021-04-20 ENCOUNTER — Other Ambulatory Visit (HOSPITAL_COMMUNITY): Payer: Self-pay

## 2021-05-04 ENCOUNTER — Encounter (HOSPITAL_BASED_OUTPATIENT_CLINIC_OR_DEPARTMENT_OTHER): Payer: Self-pay | Admitting: Nurse Practitioner

## 2021-05-04 ENCOUNTER — Other Ambulatory Visit: Payer: Self-pay

## 2021-05-04 ENCOUNTER — Other Ambulatory Visit (HOSPITAL_COMMUNITY): Payer: Self-pay

## 2021-05-04 ENCOUNTER — Ambulatory Visit (INDEPENDENT_AMBULATORY_CARE_PROVIDER_SITE_OTHER): Payer: No Typology Code available for payment source | Admitting: Nurse Practitioner

## 2021-05-04 DIAGNOSIS — F900 Attention-deficit hyperactivity disorder, predominantly inattentive type: Secondary | ICD-10-CM | POA: Diagnosis not present

## 2021-05-04 MED ORDER — LISDEXAMFETAMINE DIMESYLATE 20 MG PO CAPS
20.0000 mg | ORAL_CAPSULE | Freq: Every day | ORAL | 0 refills | Status: DC
  Filled 2021-05-04: qty 30, 30d supply, fill #0

## 2021-05-04 MED ORDER — LISDEXAMFETAMINE DIMESYLATE 20 MG PO CAPS
20.0000 mg | ORAL_CAPSULE | Freq: Every day | ORAL | 0 refills | Status: DC
  Filled 2021-07-20: qty 30, 30d supply, fill #0

## 2021-05-04 MED ORDER — LISDEXAMFETAMINE DIMESYLATE 20 MG PO CAPS
20.0000 mg | ORAL_CAPSULE | Freq: Every day | ORAL | 0 refills | Status: DC
  Filled 2021-06-13: qty 30, 30d supply, fill #0

## 2021-05-04 NOTE — Progress Notes (Signed)
There were no vitals taken for this visit.   Subjective:    Patient ID: Hannah Duncan, female    DOB: May 03, 1995, 26 y.o.   MRN: [id number]  HPI: Hannah Duncan is a 26 y.o. female presenting on 05/04/2021 for comprehensive medical examination.   Current medical concerns include: ADHD. Not currently on any treatment. Has discussed in the past the option to restart  Vyvanse as she has had success on 52m daily. Diagnosis since teens. 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   She reports regular vision exams q1-5y: yes She reports regular dental exams q [id number] Her diet consists of:  healthy She works in:  [employer] surgeonShe denies ETOH overuse She denies nictoine use She denies illegal substance use   She reports regular menses and no current menopausal symptoms  She is currently sexually active with her spouse and no other partners  She denies concerns today about STI  She denies concerns about skin changes today  She denies concerns about bowel changes today  She denies concerns about bladder changes today   Most Recent Depression Screen:  [facility] 10/16/2020 02/13/2020 08/01/2019 07/19/2019 07/09/2019  Decreased Interest 1 0 2 3 0  Down, Depressed, Hopeless 0 0 2 3 0  PHQ - 2 Score 1 0 4 6 0  Altered sleeping 0 0 _0 Tired, decreased energy 0 0 _1 Change in appetite 0 0 3 3 0  Feeling bad or failure about yourself  0 0 1 3 0  Trouble concentrating 0 0 2 3 0  Moving slowly or fidgety/restless 0 0 0 1 0  Suicidal thoughts 0 0 0 0 0  PHQ-9 Score 1 0 _2 Difficult doing work/chores Not difficult at all Not difficult at all - - -  Some recent data might be hidden   Most Recent Anxiety Screen:  GAD 7 : Generalized Anxiety Score 08/01/2019 07/19/2019 07/09/2019 07/04/2019  Nervous, Anxious, on Edge 1 3 0 0  Control/stop worrying 1 3 0 0  Worry too much - different things 1 3 0 0  Trouble relaxing 2 3 0 0  Restless  1 3 0 0  Easily annoyed or irritable 1 1 0 0  Afraid - awful might happen 1 2 0 0  Total GAD 7 Score 8 18 0 0  Anxiety Difficulty Not difficult at all - - -   Most Recent Fall Screen: Fall Risk  10/16/2020 05/09/2018  Falls in the past year? 0 0  Number falls in past yr: 0 -  Injury with Fall? 0 -  Risk for fall due to : No Fall Risks -  Follow up Falls evaluation completed -    All ROS negative except what is listed above and in the HPI.   Past medical history, surgical history, medications, allergies, family history and social history reviewed with patient today and changes made to appropriate areas of the chart.  Past Medical History:  Past Medical History:  Diagnosis Date   Contusion of left knee 05/22/2017   Depression    Medications:  Current Outpatient Medications on File Prior to Visit  Medication Sig   buPROPion (WELLBUTRIN XL) 150 MG 24 hr tablet TAKE 1 TABLET BY MOUTH IN THE MORNING AND AT BEDTIME   busPIRone (BUSPAR) 10 MG tablet TAKE 1 TABLET BY MOUTH 3 TIMES DAILY   drospirenone-ethinyl estradiol (YAZ) 3-0.02 MG tablet  TAKE 1 TABLET BY MOUTH ONCE A DAY   spironolactone (ALDACTONE) 50 MG tablet TAKE 1 TABLET BY MOUTH ONCE A DAY   No current facility-administered medications on file prior to visit.   Surgical History:  Past Surgical History:  Procedure Laterality Date   WISDOM TOOTH EXTRACTION  2012   Allergies:  No Known Allergies Social History:  Social History   Socioeconomic History   Marital status: Married    Spouse name: Not on file   Number of children: 1   Years of education: Not on file   Highest education level: Associate degree: occupational, Hotel manager, or vocational program  Occupational History   Occupation: Optician, dispensing: [employer]  Tobacco Use   Smoking status: Never   Smokeless tobacco: Never  Vaping Use   Vaping Use: Never used  Substance and Sexual Activity   Alcohol use: Not Currently    Comment: occasional   Drug use:  Never   Sexual activity: Yes    Birth control/protection: Pill, Condom    Comment: stopped taking ocp 11/13/18  Other Topics Concern   Not on file  Social History Narrative   Not on file   Social Determinants of Health   Financial Resource Strain: Not on file  Food Insecurity: Not on file  Transportation Needs: Not on file  Physical Activity: Not on file  Stress: Not on file  Social Connections: Not on file  Intimate Partner Violence: Not on file   Social History   Tobacco Use  Smoking Status Never  Smokeless Tobacco Never   Social History   Substance and Sexual Activity  Alcohol Use Not Currently   Comment: occasional   Family History:  Family History  Problem Relation Age of Onset   Cancer Mother        melanoma   Hypertension Mother    Hyperlipidemia Father    Cancer Maternal Grandmother        breast cancer   Cancer Maternal Grandfather        skin cancer       Objective:    There were no vitals taken for this visit.  Wt Readings from Last 3 Encounters:  10/16/20 154 lb 6.4 oz (70 kg)  04/10/20 152 lb 12.8 oz (69.3 kg)  02/13/20 158 lb 3.2 oz (71.8 kg)    Physical Exam Vitals and nursing note reviewed.  Constitutional:      General: She is not in acute distress.    Appearance: Normal appearance.  HENT:     Head: Normocephalic and atraumatic.     Right Ear: Hearing, tympanic membrane, ear canal and external ear normal.     Left Ear: Hearing, tympanic membrane, ear canal and external ear normal.     Nose: Nose normal.     Right Sinus: No maxillary sinus tenderness or frontal sinus tenderness.     Left Sinus: No maxillary sinus tenderness or frontal sinus tenderness.     Mouth/Throat:     Lips: Pink.     Mouth: Mucous membranes are moist.     Pharynx: Oropharynx is clear.  Eyes:     General: Lids are normal. Vision grossly intact.     Extraocular Movements: Extraocular movements intact.     Conjunctiva/sclera: Conjunctivae normal.     Pupils:  Pupils are equal, round, and reactive to light.     Funduscopic exam:    Right eye: Red reflex present.        Left eye: Red  reflex present.    Visual Fields: Right eye visual fields normal and left eye visual fields normal.  Neck:     Thyroid: No thyromegaly.     Vascular: No carotid bruit.  Cardiovascular:     Rate and Rhythm: Normal rate and regular rhythm.     Chest Wall: PMI is not displaced.     Pulses: Normal pulses.          Dorsalis pedis pulses are 2+ on the right side and 2+ on the left side.       Posterior tibial pulses are 2+ on the right side and 2+ on the left side.     Heart sounds: Normal heart sounds. No murmur heard. Pulmonary:     Effort: Pulmonary effort is normal. No respiratory distress.     Breath sounds: Normal breath sounds.  Abdominal:     General: Abdomen is flat. Bowel sounds are normal. There is no distension.     Palpations: Abdomen is soft. There is no hepatomegaly, splenomegaly or mass.     Tenderness: There is no abdominal tenderness. There is no right CVA tenderness, left CVA tenderness, guarding or rebound.  Musculoskeletal:        General: Normal range of motion.     Cervical back: Full passive range of motion without pain, normal range of motion and neck supple. No tenderness.     Right lower leg: No edema.     Left lower leg: No edema.  Feet:     Left foot:     Toenail Condition: Left toenails are normal.  Lymphadenopathy:     Cervical: No cervical adenopathy.     Upper Body:     Right upper body: No supraclavicular adenopathy.     Left upper body: No supraclavicular adenopathy.  Skin:    General: Skin is warm and dry.     Capillary Refill: Capillary refill takes less than 2 seconds.     Nails: There is no clubbing.  Neurological:     General: No focal deficit present.     Mental Status: She is alert and oriented to person, place, and time.     GCS: GCS eye subscore is 4. GCS verbal subscore is 5. GCS motor subscore is 6.     Cranial  Nerves: No cranial nerve deficit.     Sensory: Sensation is intact. No sensory deficit.     Motor: Motor function is intact. No weakness.     Coordination: Coordination is intact. Coordination normal.     Gait: Gait is intact. Gait normal.     Deep Tendon Reflexes: Reflexes are normal and symmetric.  Psychiatric:        Attention and Perception: Attention normal.        Mood and Affect: Mood normal.        Speech: Speech normal.        Behavior: Behavior normal. Behavior is cooperative.        Thought Content: Thought content normal.        Cognition and Memory: Cognition and memory normal.        Judgment: Judgment normal.    Results for orders placed or performed in visit on 10/16/20  Comprehensive metabolic panel  Result Value Ref Range   Glucose 84 65 - 99 mg/dL   BUN 11 6 - 20 mg/dL   Creatinine, Ser 0.80 0.57 - 1.00 mg/dL   eGFR 105 >59 mL/min/1.73   BUN/Creatinine Ratio 14 9 - 23  Sodium 138 134 - 144 mmol/L   Potassium 4.0 3.5 - 5.2 mmol/L   Chloride 101 96 - 106 mmol/L   CO2 23 20 - 29 mmol/L   Calcium 9.3 8.7 - 10.2 mg/dL   Total Protein 7.6 6.0 - 8.5 g/dL   Albumin 4.7 3.9 - 5.0 g/dL   Globulin, Total 2.9 1.5 - 4.5 g/dL   Albumin/Globulin Ratio 1.6 1.2 - 2.2   Bilirubin Total <0.2 0.0 - 1.2 mg/dL   Alkaline Phosphatase 78 44 - 121 IU/L   AST 15 0 - 40 IU/L   ALT 12 0 - 32 IU/L  CBC with Differential/Platelet  Result Value Ref Range   WBC 8.7 3.4 - 10.8 x10E3/uL   RBC 4.57 3.77 - 5.28 x10E6/uL   Hemoglobin 12.5 11.1 - 15.9 g/dL   Hematocrit 38.6 34.0 - 46.6 %   MCV 85 79 - 97 fL   MCH 27.4 26.6 - 33.0 pg   MCHC 32.4 31.5 - 35.7 g/dL   RDW 13.2 11.7 - 15.4 %   Platelets 313 150 - 450 x10E3/uL   Neutrophils 63 Not Estab. %   Lymphs 27 Not Estab. %   Monocytes 8 Not Estab. %   Eos 1 Not Estab. %   Basos 1 Not Estab. %   Neutrophils Absolute 5.5 1.4 - 7.0 x10E3/uL   Lymphocytes Absolute 2.4 0.7 - 3.1 x10E3/uL   Monocytes Absolute 0.7 0.1 - 0.9 x10E3/uL    EOS (ABSOLUTE) 0.1 0.0 - 0.4 x10E3/uL   Basophils Absolute 0.1 0.0 - 0.2 x10E3/uL   Immature Granulocytes 0 Not Estab. %   Immature Grans (Abs) 0.0 0.0 - 0.1 x10E3/uL  TSH  Result Value Ref Range   TSH 2.480 0.450 - 4.500 uIU/mL      Assessment & Plan:   Problem List Items Addressed This Visit     Attention deficit hyperactivity disorder (ADHD) - Primary   Relevant Medications   lisdexamfetamine (VYVANSE) 20 MG capsule   lisdexamfetamine (VYVANSE) 20 MG capsule (Start on 06/01/2021)   lisdexamfetamine (VYVANSE) 20 MG capsule (Start on 06/29/2021)      IMMUNIZATIONS:   - Tdap: Tetanus vaccination status reviewed: last tetanus booster within 10 years. - Influenza: Up to date - Pneumovax: Not applicable - Prevnar: Not applicable - HPV: Not applicable - Zostavax vaccine: Not applicable  SCREENING: - Pap smear: done elsewhere - STI testing: deferred -Mammogram: Not applicable  - Colonoscopy: Not applicable  - Bone Density: Not applicable  -Hearing Test: Not applicable  -Spirometry: Not applicable   Follow up plan: Return in about 3 months (around 08/01/2021) for virtual adhd.  NEXT PREVENTATIVE PHYSICAL DUE IN 1 YEAR.  PATIENT COUNSELING PROVIDED:   For all adult patients, I recommend   A well balanced diet low in saturated fats, cholesterol, and moderation in carbohydrates.   This can be as simple as monitoring portion sizes and cutting back on sugary beverages such as soda and juice to start with.    Daily water consumption of at least 64 ounces.  Physical activity at least 180 minutes per week, if just starting out.   This can be as simple as taking the stairs instead of the elevator and walking 2-3 laps around the office  purposefully every day.   STD protection, partner selection, and regular testing if high risk.  Limited consumption of alcoholic beverages if alcohol is consumed.  For women, I recommend no more than 7 alcoholic beverages per week, spread out  throughout the  week.  Avoid "binge" drinking or consuming large quantities of alcohol in one setting.   Please let me know if you feel you may need help with reduction or quitting alcohol consumption.   Avoidance of nicotine, if used.  Please let me know if you feel you may need help with reduction or quitting nicotine use.   Daily mental health attention.  This can be in the form of 5 minute daily meditation, prayer, journaling, yoga, reflection, etc.   Purposeful attention to your emotions and mental state can significantly improve your overall wellbeing and Health.  Please know that I am here to help you with all of your health care goals and am happy to work with you to find a solution that works best for you.  The greatest advice I have received with any changes in life are to take it one step at a time, that even means if all you can focus on is the next 60 seconds, then do that and celebrate your victories.  With any changes in life, you will have set backs, and that is OK. The important thing to remember is, if you have a set back, it is not a failure, it is an opportunity to try again!  Health Maintenance Recommendations Screening Testing Mammogram Every 1 -2 years based on history and risk factors Starting at age 22 Pap Smear Ages 21-39 every 3 years Ages 46-65 every 5 years with HPV testing More frequent testing may be required based on results and history Colon Cancer Screening Every 1-10 years based on test performed, risk factors, and history Starting at age 42 Bone Density Screening Every 2-10 years based on history Starting at age 28 for women Recommendations for men differ based on medication usage, history, and risk factors AAA Screening One time ultrasound Men 34-49 years old who have every smoked Lung Cancer Screening Low Dose Lung CT every 12 months Age 68-80 years with a 30 pack-year smoking history who still smoke or who have quit within the last 15  years  Screening Labs Routine  Labs: Complete Blood Count (CBC), Complete Metabolic Panel (CMP), Cholesterol (Lipid Panel) Every 6-12 months based on history and medications May be recommended more frequently based on current conditions or previous results Hemoglobin A1c Lab Every 3-12 months based on history and previous results Starting at age 20 or earlier with diagnosis of diabetes, high cholesterol, BMI >26, and/or risk factors Frequent monitoring for patients with diabetes to ensure blood sugar control Thyroid Panel (TSH w/ T3 & T4) Every 6 months based on history, symptoms, and risk factors May be repeated more often if on medication HIV One time testing for all patients 15 and older May be repeated more frequently for patients with increased risk factors or exposure Hepatitis C One time testing for all patients 75 and older May be repeated more frequently for patients with increased risk factors or exposure Gonorrhea, Chlamydia Every 12 months for all sexually active persons 13-24 years Additional monitoring may be recommended for those who are considered high risk or who have symptoms PSA Men 66-64 years old with risk factors Additional screening may be recommended from age 7-69 based on risk factors, symptoms, and history  Vaccine Recommendations Tetanus Booster All adults every 10 years Flu Vaccine All patients 6 months and older every year COVID Vaccine All patients 12 years and older Initial dosing with booster May recommend additional booster based on age and health history HPV Vaccine 2 doses all patients age 65-26  Dosing may be considered for patients over 26 Shingles Vaccine (Shingrix) 2 doses all adults 29 years and older Pneumonia (Pneumovax 55) All adults 92 years and older May recommend earlier dosing based on health history Pneumonia (Prevnar 13) All adults 31 years and older Dosed 1 year after Pneumovax 23  Additional Screening, Testing, and  Vaccinations may be recommended on an individualized basis based on family history, health history, risk factors, and/or exposure.

## 2021-05-04 NOTE — Patient Instructions (Signed)
We will plan to start Vyvanse. Let me how you are doing on this.

## 2021-05-06 NOTE — Assessment & Plan Note (Signed)
Positive Dx historically with psychiatry. Previously tried methylphenidate- no current stimulant medications.  Plan to restart medication today with Vyvanse 20mg .  Patient aware of monitoring for new or worsening anxiety, decreased appetite, or sleep interference.  Will plan to f/u in 3 months or sooner if needed.

## 2021-05-24 ENCOUNTER — Other Ambulatory Visit (HOSPITAL_BASED_OUTPATIENT_CLINIC_OR_DEPARTMENT_OTHER): Payer: Self-pay | Admitting: Nurse Practitioner

## 2021-05-24 ENCOUNTER — Other Ambulatory Visit: Payer: Self-pay

## 2021-05-24 DIAGNOSIS — L700 Acne vulgaris: Secondary | ICD-10-CM

## 2021-05-25 ENCOUNTER — Other Ambulatory Visit: Payer: Self-pay | Admitting: Family Medicine

## 2021-05-25 ENCOUNTER — Other Ambulatory Visit (HOSPITAL_COMMUNITY): Payer: Self-pay

## 2021-05-25 DIAGNOSIS — F331 Major depressive disorder, recurrent, moderate: Secondary | ICD-10-CM

## 2021-05-25 MED ORDER — DROSPIRENONE-ETHINYL ESTRADIOL 3-0.02 MG PO TABS
1.0000 | ORAL_TABLET | Freq: Every day | ORAL | 1 refills | Status: DC
  Filled 2021-05-25 – 2021-07-22 (×2): qty 84, 84d supply, fill #0
  Filled 2021-10-26: qty 84, 84d supply, fill #1

## 2021-05-25 MED ORDER — SPIRONOLACTONE 50 MG PO TABS
ORAL_TABLET | Freq: Every day | ORAL | 0 refills | Status: DC
  Filled 2021-05-25: qty 90, 90d supply, fill #0

## 2021-05-26 ENCOUNTER — Other Ambulatory Visit (HOSPITAL_COMMUNITY): Payer: Self-pay

## 2021-05-26 MED ORDER — BUPROPION HCL ER (XL) 150 MG PO TB24
ORAL_TABLET | ORAL | 3 refills | Status: DC
  Filled 2021-05-26: qty 180, fill #0
  Filled 2021-07-22: qty 180, 90d supply, fill #0
  Filled 2021-10-26: qty 180, 90d supply, fill #1
  Filled 2022-01-18: qty 180, 90d supply, fill #2
  Filled 2022-04-18: qty 180, 90d supply, fill #3

## 2021-06-14 ENCOUNTER — Other Ambulatory Visit (HOSPITAL_COMMUNITY): Payer: Self-pay

## 2021-06-16 ENCOUNTER — Other Ambulatory Visit (HOSPITAL_COMMUNITY): Payer: Self-pay

## 2021-07-01 ENCOUNTER — Other Ambulatory Visit (HOSPITAL_COMMUNITY): Payer: Self-pay

## 2021-07-01 ENCOUNTER — Telehealth: Payer: No Typology Code available for payment source | Admitting: Physician Assistant

## 2021-07-01 DIAGNOSIS — R110 Nausea: Secondary | ICD-10-CM

## 2021-07-01 MED ORDER — ONDANSETRON 4 MG PO TBDP
4.0000 mg | ORAL_TABLET | Freq: Three times a day (TID) | ORAL | 0 refills | Status: DC | PRN
  Filled 2021-07-01: qty 20, 7d supply, fill #0

## 2021-07-01 NOTE — Progress Notes (Signed)
E-Visit for Nausea and Vomiting   We are sorry that you are not feeling well. Here is how we plan to help!  Based on what you have shared with me it looks like you have a Virus that is irritating your GI tract.  Vomiting is the forceful emptying of a portion of the stomach's content through the mouth.  Although nausea and vomiting can make you feel miserable, it's important to remember that these are not diseases, but rather symptoms of an underlying illness.  When we treat short term symptoms, we always caution that any symptoms that persist should be fully evaluated in a medical office.  I have prescribed a medication that will help alleviate your symptoms and allow you to stay hydrated:  Zofran 4 mg 1 tablet every 8 hours as needed for nausea and vomiting  HOME CARE: Drink clear liquids.  This is very important! Dehydration (the lack of fluid) can lead to a serious complication.  Start off with 1 tablespoon every 5 minutes for 8 hours. You may begin eating bland foods after 8 hours without vomiting.  Start with saltine crackers, white bread, rice, mashed potatoes, applesauce. After 48 hours on a bland diet, you may resume a normal diet. Try to go to sleep.  Sleep often empties the stomach and relieves the need to vomit.  GET HELP RIGHT AWAY IF:  Your symptoms do not improve or worsen within 2 days after treatment. You have a fever for over 3 days. You cannot keep down fluids after trying the medication.  MAKE SURE YOU:  Understand these instructions. Will watch your condition. Will get help right away if you are not doing well or get worse.    Thank you for choosing an e-visit.  Your e-visit answers were reviewed by a board certified advanced clinical practitioner to complete your personal care plan. Depending upon the condition, your plan could have included both over the counter or prescription medications.  Please review your pharmacy choice. Make sure the pharmacy is open so  you can pick up prescription now. If there is a problem, you may contact your provider through MyChart messaging and have the prescription routed to another pharmacy.  Your safety is important to us. If you have drug allergies check your prescription carefully.   For the next 24 hours you can use MyChart to ask questions about today's visit, request a non-urgent call back, or ask for a work or school excuse. You will get an email in the next two days asking about your experience. I hope that your e-visit has been valuable and will speed your recovery.  

## 2021-07-01 NOTE — Progress Notes (Signed)
I have spent 5 minutes in review of e-visit questionnaire, review and updating patient chart, medical decision making and response to patient.   Jozelyn Kuwahara Cody Giuseppina Quinones, PA-C    

## 2021-07-20 ENCOUNTER — Other Ambulatory Visit (HOSPITAL_COMMUNITY): Payer: Self-pay

## 2021-07-23 ENCOUNTER — Other Ambulatory Visit (HOSPITAL_COMMUNITY): Payer: Self-pay

## 2021-08-02 ENCOUNTER — Other Ambulatory Visit (HOSPITAL_COMMUNITY): Payer: Self-pay

## 2021-08-02 ENCOUNTER — Encounter (HOSPITAL_BASED_OUTPATIENT_CLINIC_OR_DEPARTMENT_OTHER): Payer: Self-pay | Admitting: Nurse Practitioner

## 2021-08-02 ENCOUNTER — Ambulatory Visit (INDEPENDENT_AMBULATORY_CARE_PROVIDER_SITE_OTHER): Payer: No Typology Code available for payment source | Admitting: Nurse Practitioner

## 2021-08-02 DIAGNOSIS — F900 Attention-deficit hyperactivity disorder, predominantly inattentive type: Secondary | ICD-10-CM

## 2021-08-02 DIAGNOSIS — F331 Major depressive disorder, recurrent, moderate: Secondary | ICD-10-CM | POA: Diagnosis not present

## 2021-08-02 MED ORDER — LISDEXAMFETAMINE DIMESYLATE 40 MG PO CAPS
40.0000 mg | ORAL_CAPSULE | Freq: Every day | ORAL | 0 refills | Status: DC
  Filled 2021-09-13: qty 30, 30d supply, fill #0

## 2021-08-02 MED ORDER — LISDEXAMFETAMINE DIMESYLATE 40 MG PO CAPS
40.0000 mg | ORAL_CAPSULE | Freq: Every day | ORAL | 0 refills | Status: DC
  Filled 2021-10-26: qty 30, 30d supply, fill #0

## 2021-08-02 MED ORDER — LISDEXAMFETAMINE DIMESYLATE 40 MG PO CAPS
40.0000 mg | ORAL_CAPSULE | Freq: Every day | ORAL | 0 refills | Status: DC
  Filled 2021-08-02 – 2021-08-17 (×2): qty 30, 30d supply, fill #0

## 2021-08-02 NOTE — Assessment & Plan Note (Signed)
Chronic. Symptoms currently stable on current treatment plan. PDMP reviewed today. No alarm symptoms present today. Today we have Continued medication regimen of Vyvanse 40mg  per day.  Recommend the following to the patient: Monitor for increased anxiety, depression, anger, aggression, sleep problems, loss of appetite, difficulty focusing, rebound symptoms, or other bothersome symptoms and report immediately.  If current dose or medication is not working well, please contact the office to schedule a follow-up visit to discuss options.  Medications will not be refilled Consuelo Suthers for any circumstances. Requests for Kodi Steil fills will not be acknowledged and repeated requests may result in decision to no longer prescribed this medication.  We will plan to follow-up in 3 months for Virtual re-evaluation and refills.

## 2021-08-02 NOTE — Assessment & Plan Note (Signed)
Chronic. Well controlled on current regimen. No alarm sx present today. No refills needed at this time.  Recommend the following to patient: Monitor for new or worsening symptoms of depression or side effects of medication and report to provider immediately.

## 2021-08-02 NOTE — Progress Notes (Signed)
Virtual Visit Encounter telephone visit.   I connected with  Hannah Duncan on 08/02/21 at  8:10 AM EDT by secure audio telemedicine application. I verified that I am speaking with the correct person using two identifiers.   I introduced myself as a Publishing rights manager with the practice. The limitations of evaluation and management by telemedicine discussed with the patient and the availability of in person appointments. The patient expressed verbal understanding and consent to proceed.  Participating parties in this visit include: Myself and patient  The patient is: Patient Location: Home I am: Provider Location: Office/Clinic Subjective:    CC and HPI: Hannah Duncan is a 26 y.o. year old female presenting for follow up of ADHD. Patient reports the following: ADHD Follow-Up Patient reports the following: Recent increase in dose to Vyvanse 40mg  per day with providers recommendations symptoms are controlled  is satisfied with current medication is taking medication as prescribed is taking medication on weekends and holidays Work/School performance is  excellent is able to maintain focus throughout the day is able to sustain tasks requiring attention is able to complete tasks is not easily distracted is not experiencing negative side effects of medication is not  experiencing increased anxiety is not  experiencing difficulty sleeping or falling asleep is not  experiencing irritibility is not more anxious than without medication is not experiencing dizziness is not experiencing rebound effects when medication wears off does wish to remain on current therapy  Past medical history, Surgical history, Family history not pertinant except as noted below, Social history, Allergies, and medications have been entered into the medical record, reviewed, and corrections made.   Review of Systems:  All review of systems negative except what is listed in the HPI  Objective:    Alert and oriented  x 4 Speaking in clear sentences with no shortness of breath. No distress.  Impression and Recommendations:    Problem List Items Addressed This Visit   None   orders and follow up as documented in EMR I discussed the assessment and treatment plan with the patient. The patient was provided an opportunity to ask questions and all were answered. The patient agreed with the plan and demonstrated an understanding of the instructions.   The patient was advised to call back or seek an in-person evaluation if the symptoms worsen or if the condition fails to improve as anticipated.  Follow-Up: in 3 months  I provided 17 minutes of non-face-to-face interaction with this non face-to-face encounter including intake, same-day documentation, and chart review.   , NP , DNP, AGNP-c [facility] at [facility] [phone] (fax)

## 2021-08-17 ENCOUNTER — Other Ambulatory Visit (HOSPITAL_COMMUNITY): Payer: Self-pay

## 2021-08-18 ENCOUNTER — Other Ambulatory Visit (HOSPITAL_COMMUNITY): Payer: Self-pay

## 2021-09-07 ENCOUNTER — Other Ambulatory Visit (HOSPITAL_BASED_OUTPATIENT_CLINIC_OR_DEPARTMENT_OTHER): Payer: Self-pay | Admitting: Nurse Practitioner

## 2021-09-07 DIAGNOSIS — L700 Acne vulgaris: Secondary | ICD-10-CM

## 2021-09-08 ENCOUNTER — Other Ambulatory Visit (HOSPITAL_COMMUNITY): Payer: Self-pay

## 2021-09-15 ENCOUNTER — Other Ambulatory Visit (HOSPITAL_COMMUNITY): Payer: Self-pay

## 2021-09-16 ENCOUNTER — Other Ambulatory Visit (HOSPITAL_COMMUNITY): Payer: Self-pay

## 2021-09-16 ENCOUNTER — Encounter (HOSPITAL_BASED_OUTPATIENT_CLINIC_OR_DEPARTMENT_OTHER): Payer: Self-pay | Admitting: Nurse Practitioner

## 2021-09-16 ENCOUNTER — Other Ambulatory Visit (HOSPITAL_BASED_OUTPATIENT_CLINIC_OR_DEPARTMENT_OTHER): Payer: Self-pay

## 2021-09-16 DIAGNOSIS — L700 Acne vulgaris: Secondary | ICD-10-CM

## 2021-09-16 DIAGNOSIS — Z0000 Encounter for general adult medical examination without abnormal findings: Secondary | ICD-10-CM

## 2021-09-16 MED ORDER — SPIRONOLACTONE 50 MG PO TABS
ORAL_TABLET | Freq: Every day | ORAL | 0 refills | Status: DC
  Filled 2021-09-16: qty 90, 90d supply, fill #0

## 2021-10-27 ENCOUNTER — Other Ambulatory Visit (HOSPITAL_COMMUNITY): Payer: Self-pay

## 2021-12-04 ENCOUNTER — Other Ambulatory Visit (HOSPITAL_BASED_OUTPATIENT_CLINIC_OR_DEPARTMENT_OTHER): Payer: Self-pay | Admitting: Nurse Practitioner

## 2021-12-04 DIAGNOSIS — F900 Attention-deficit hyperactivity disorder, predominantly inattentive type: Secondary | ICD-10-CM

## 2021-12-06 ENCOUNTER — Other Ambulatory Visit (HOSPITAL_COMMUNITY): Payer: Self-pay

## 2021-12-09 ENCOUNTER — Other Ambulatory Visit (HOSPITAL_COMMUNITY): Payer: Self-pay

## 2021-12-12 ENCOUNTER — Other Ambulatory Visit (HOSPITAL_BASED_OUTPATIENT_CLINIC_OR_DEPARTMENT_OTHER): Payer: Self-pay | Admitting: Nurse Practitioner

## 2021-12-12 MED ORDER — LISDEXAMFETAMINE DIMESYLATE 40 MG PO CAPS
40.0000 mg | ORAL_CAPSULE | Freq: Every day | ORAL | 0 refills | Status: DC
  Filled 2021-12-12: qty 30, 30d supply, fill #0

## 2021-12-13 ENCOUNTER — Other Ambulatory Visit (HOSPITAL_COMMUNITY): Payer: Self-pay

## 2021-12-15 ENCOUNTER — Other Ambulatory Visit (HOSPITAL_COMMUNITY): Payer: Self-pay

## 2022-01-04 ENCOUNTER — Other Ambulatory Visit (HOSPITAL_COMMUNITY): Payer: Self-pay

## 2022-01-04 ENCOUNTER — Other Ambulatory Visit (HOSPITAL_BASED_OUTPATIENT_CLINIC_OR_DEPARTMENT_OTHER): Payer: Self-pay | Admitting: Nurse Practitioner

## 2022-01-04 DIAGNOSIS — Z0000 Encounter for general adult medical examination without abnormal findings: Secondary | ICD-10-CM

## 2022-01-04 MED ORDER — SPIRONOLACTONE 50 MG PO TABS
50.0000 mg | ORAL_TABLET | Freq: Every day | ORAL | 0 refills | Status: DC
  Filled 2022-01-04: qty 85, 85d supply, fill #0
  Filled 2022-01-05: qty 5, 5d supply, fill #0

## 2022-01-05 ENCOUNTER — Other Ambulatory Visit (HOSPITAL_COMMUNITY): Payer: Self-pay

## 2022-01-18 ENCOUNTER — Other Ambulatory Visit (HOSPITAL_COMMUNITY): Payer: Self-pay

## 2022-01-18 ENCOUNTER — Other Ambulatory Visit (HOSPITAL_BASED_OUTPATIENT_CLINIC_OR_DEPARTMENT_OTHER): Payer: Self-pay | Admitting: Nurse Practitioner

## 2022-01-18 DIAGNOSIS — L700 Acne vulgaris: Secondary | ICD-10-CM

## 2022-01-18 DIAGNOSIS — Z0000 Encounter for general adult medical examination without abnormal findings: Secondary | ICD-10-CM

## 2022-01-19 ENCOUNTER — Other Ambulatory Visit (HOSPITAL_COMMUNITY): Payer: Self-pay

## 2022-01-19 MED ORDER — SPIRONOLACTONE 50 MG PO TABS
50.0000 mg | ORAL_TABLET | Freq: Every day | ORAL | 0 refills | Status: DC
  Filled 2022-01-19 – 2022-04-18 (×2): qty 90, 90d supply, fill #0

## 2022-01-19 MED ORDER — DROSPIRENONE-ETHINYL ESTRADIOL 3-0.02 MG PO TABS
1.0000 | ORAL_TABLET | Freq: Every day | ORAL | 1 refills | Status: DC
  Filled 2022-01-19: qty 84, 84d supply, fill #0
  Filled 2022-04-18: qty 84, 84d supply, fill #1

## 2022-02-02 ENCOUNTER — Other Ambulatory Visit (HOSPITAL_BASED_OUTPATIENT_CLINIC_OR_DEPARTMENT_OTHER): Payer: Self-pay | Admitting: Nurse Practitioner

## 2022-02-02 DIAGNOSIS — F900 Attention-deficit hyperactivity disorder, predominantly inattentive type: Secondary | ICD-10-CM

## 2022-02-04 ENCOUNTER — Other Ambulatory Visit (HOSPITAL_BASED_OUTPATIENT_CLINIC_OR_DEPARTMENT_OTHER): Payer: Self-pay | Admitting: Nurse Practitioner

## 2022-02-04 ENCOUNTER — Other Ambulatory Visit (HOSPITAL_COMMUNITY): Payer: Self-pay

## 2022-02-04 DIAGNOSIS — F900 Attention-deficit hyperactivity disorder, predominantly inattentive type: Secondary | ICD-10-CM

## 2022-02-07 MED ORDER — LISDEXAMFETAMINE DIMESYLATE 40 MG PO CAPS
40.0000 mg | ORAL_CAPSULE | Freq: Every day | ORAL | 0 refills | Status: DC
  Filled 2022-02-07: qty 30, 30d supply, fill #0

## 2022-02-08 ENCOUNTER — Encounter: Payer: No Typology Code available for payment source | Admitting: Nurse Practitioner

## 2022-02-08 ENCOUNTER — Other Ambulatory Visit (HOSPITAL_COMMUNITY): Payer: Self-pay

## 2022-03-15 ENCOUNTER — Other Ambulatory Visit: Payer: Self-pay

## 2022-03-15 ENCOUNTER — Other Ambulatory Visit (HOSPITAL_BASED_OUTPATIENT_CLINIC_OR_DEPARTMENT_OTHER): Payer: Self-pay | Admitting: Nurse Practitioner

## 2022-03-15 DIAGNOSIS — F900 Attention-deficit hyperactivity disorder, predominantly inattentive type: Secondary | ICD-10-CM

## 2022-03-15 MED ORDER — LISDEXAMFETAMINE DIMESYLATE 40 MG PO CAPS: 40.0000 mg | ORAL_CAPSULE | Freq: Every day | ORAL | 0 refills | Status: DC

## 2022-03-15 MED ORDER — LISDEXAMFETAMINE DIMESYLATE 40 MG PO CAPS
40.0000 mg | ORAL_CAPSULE | Freq: Every day | ORAL | 0 refills | Status: DC
  Filled 2022-04-18 – 2022-04-19 (×2): qty 30, 30d supply, fill #0

## 2022-03-15 MED ORDER — LISDEXAMFETAMINE DIMESYLATE 40 MG PO CAPS
40.0000 mg | ORAL_CAPSULE | Freq: Every day | ORAL | 0 refills | Status: DC
  Filled 2022-03-15: qty 30, 30d supply, fill #0

## 2022-03-16 ENCOUNTER — Other Ambulatory Visit (HOSPITAL_COMMUNITY): Payer: Self-pay

## 2022-04-19 ENCOUNTER — Other Ambulatory Visit: Payer: Self-pay

## 2022-04-19 ENCOUNTER — Other Ambulatory Visit (HOSPITAL_COMMUNITY): Payer: Self-pay

## 2022-04-27 ENCOUNTER — Telehealth: Payer: 59 | Admitting: Physician Assistant

## 2022-04-27 ENCOUNTER — Other Ambulatory Visit (HOSPITAL_BASED_OUTPATIENT_CLINIC_OR_DEPARTMENT_OTHER): Payer: Self-pay

## 2022-04-27 DIAGNOSIS — B9689 Other specified bacterial agents as the cause of diseases classified elsewhere: Secondary | ICD-10-CM | POA: Diagnosis not present

## 2022-04-27 DIAGNOSIS — J019 Acute sinusitis, unspecified: Secondary | ICD-10-CM | POA: Diagnosis not present

## 2022-04-27 MED ORDER — AMOXICILLIN-POT CLAVULANATE 875-125 MG PO TABS
1.0000 | ORAL_TABLET | Freq: Two times a day (BID) | ORAL | 0 refills | Status: DC
  Filled 2022-04-27: qty 14, 7d supply, fill #0

## 2022-04-27 NOTE — Progress Notes (Signed)

## 2022-04-27 NOTE — Progress Notes (Signed)
I have spent 5 minutes in review of e-visit questionnaire, review and updating patient chart, medical decision making and response to patient.   Zailey Audia Cody Laquincy Eastridge, PA-C    

## 2022-05-06 ENCOUNTER — Ambulatory Visit (INDEPENDENT_AMBULATORY_CARE_PROVIDER_SITE_OTHER): Payer: 59 | Admitting: Nurse Practitioner

## 2022-05-06 ENCOUNTER — Encounter: Payer: Self-pay | Admitting: Nurse Practitioner

## 2022-05-06 ENCOUNTER — Other Ambulatory Visit (HOSPITAL_COMMUNITY): Payer: Self-pay

## 2022-05-06 VITALS — BP 112/74 | HR 91 | Ht 66.0 in | Wt 144.4 lb

## 2022-05-06 DIAGNOSIS — F418 Other specified anxiety disorders: Secondary | ICD-10-CM | POA: Diagnosis not present

## 2022-05-06 DIAGNOSIS — Z0000 Encounter for general adult medical examination without abnormal findings: Secondary | ICD-10-CM

## 2022-05-06 DIAGNOSIS — F900 Attention-deficit hyperactivity disorder, predominantly inattentive type: Secondary | ICD-10-CM | POA: Diagnosis not present

## 2022-05-06 DIAGNOSIS — R110 Nausea: Secondary | ICD-10-CM | POA: Insufficient documentation

## 2022-05-06 DIAGNOSIS — R5383 Other fatigue: Secondary | ICD-10-CM

## 2022-05-06 DIAGNOSIS — L700 Acne vulgaris: Secondary | ICD-10-CM

## 2022-05-06 DIAGNOSIS — F331 Major depressive disorder, recurrent, moderate: Secondary | ICD-10-CM | POA: Diagnosis not present

## 2022-05-06 DIAGNOSIS — R5382 Chronic fatigue, unspecified: Secondary | ICD-10-CM

## 2022-05-06 HISTORY — DX: Other fatigue: R53.83

## 2022-05-06 HISTORY — DX: Nausea: R11.0

## 2022-05-06 LAB — LIPID PANEL

## 2022-05-06 MED ORDER — LISDEXAMFETAMINE DIMESYLATE 40 MG PO CAPS: 40.0000 mg | ORAL_CAPSULE | ORAL | 0 refills | Status: DC

## 2022-05-06 MED ORDER — SPIRONOLACTONE 50 MG PO TABS
50.0000 mg | ORAL_TABLET | Freq: Every day | ORAL | 3 refills | Status: DC
  Filled 2022-05-06 – 2022-08-04 (×2): qty 90, 90d supply, fill #0
  Filled 2022-11-23: qty 90, 90d supply, fill #1
  Filled 2023-02-21: qty 90, 90d supply, fill #2
  Filled 2023-05-05: qty 90, 90d supply, fill #3

## 2022-05-06 MED ORDER — DROSPIRENONE-ETHINYL ESTRADIOL 3-0.02 MG PO TABS
1.0000 | ORAL_TABLET | Freq: Every day | ORAL | 3 refills | Status: DC
  Filled 2022-05-06 – 2022-08-04 (×4): qty 84, 84d supply, fill #0
  Filled 2022-11-23: qty 84, 84d supply, fill #1

## 2022-05-06 MED ORDER — BUPROPION HCL ER (XL) 150 MG PO TB24
300.0000 mg | ORAL_TABLET | Freq: Every day | ORAL | 3 refills | Status: DC
  Filled 2022-05-06 – 2022-08-04 (×4): qty 180, 90d supply, fill #0
  Filled 2022-11-23: qty 180, 90d supply, fill #1
  Filled 2023-02-21: qty 180, 90d supply, fill #2
  Filled 2023-05-05: qty 180, 90d supply, fill #3

## 2022-05-06 MED ORDER — LISDEXAMFETAMINE DIMESYLATE 40 MG PO CAPS
40.0000 mg | ORAL_CAPSULE | Freq: Every day | ORAL | 0 refills | Status: DC
  Filled 2022-10-03: qty 30, 30d supply, fill #0

## 2022-05-06 MED ORDER — ONDANSETRON 4 MG PO TBDP
4.0000 mg | ORAL_TABLET | Freq: Three times a day (TID) | ORAL | 6 refills | Status: AC | PRN
  Filled 2022-05-06: qty 20, 7d supply, fill #0
  Filled 2022-08-04: qty 20, 7d supply, fill #1
  Filled 2023-05-05: qty 20, 7d supply, fill #2

## 2022-05-06 MED ORDER — LISDEXAMFETAMINE DIMESYLATE 40 MG PO CAPS: 40.0000 mg | ORAL_CAPSULE | Freq: Every day | ORAL | 0 refills | Status: DC

## 2022-05-06 MED ORDER — LISDEXAMFETAMINE DIMESYLATE 40 MG PO CAPS
40.0000 mg | ORAL_CAPSULE | Freq: Every day | ORAL | 0 refills | Status: DC
  Filled 2022-08-04: qty 30, 30d supply, fill #0

## 2022-05-06 MED ORDER — BUSPIRONE HCL 10 MG PO TABS
10.0000 mg | ORAL_TABLET | Freq: Three times a day (TID) | ORAL | 3 refills | Status: AC
  Filled 2022-05-06 – 2023-02-21 (×3): qty 270, 90d supply, fill #0
  Filled 2023-05-05: qty 270, 90d supply, fill #1

## 2022-05-06 NOTE — Assessment & Plan Note (Signed)
Well controlled at this time on bupropion and buspar daily. No alarm symptoms are present. Plan:   - Continue Bupropion at 300 mg daily and buspar '10mg'$  TID.   - Monitor mood and anxiety levels.   - Adjust treatment as needed.

## 2022-05-06 NOTE — Assessment & Plan Note (Signed)
Chronic ADHD well controlled with daily Vyvanse. She has been able to get her medication from the pharmacy, but does express concerns with the timeliness of refills at times. She is not having any new or worsening symptoms. No concerns present with inconsistent use. Plan:   - Ensure patient has an adequate supply of Vyvanse. Will send in 6 month supply for her today. Given that she is so well controlled we will move follow-ups to every 6 months.    - Monitor patient's response or changes to medication.   - Address any prescription access issues.

## 2022-05-06 NOTE — Assessment & Plan Note (Signed)
Well controlled at this time on spironolactone and continuous oral contraceptives. No concerns present.  Plan:   - Continue current birth control regimen and spironolactone   - Monitor for breakthrough bleeding or side effects.   - will monitor labs today for K

## 2022-05-06 NOTE — Progress Notes (Signed)
[name], DNP, AGNP-c [place], [place] [address] Main Office [phone]  BP 112/74   Pulse 91   Ht '5\' 6"'$  (1.676 m)   Wt 144 lb 6.4 oz (65.5 kg)   BMI 23.31 kg/m    Subjective:    Patient ID: Hannah Duncan, female    DOB: 29-Nov-1995, 27 y.o.   MRN: [id number]  HPI: Malaika Mellem is a 27 y.o. female presenting on 05/06/2022 for comprehensive medical examination.  Current medical concerns include: Oralee presents today for her annual physical exam. She does express concerns primarily centered around persistent fatigue. She suspects that the fatigue may be related to the winter season or potentially low vitamin D levels. She describes feeling exhausted consistently and notes a history of borderline anemia. Additionally, the patient discusses the possibility of a connection between her fatigue and low ferritin levels, which she believes may also be related to her ADHD.  The patient mentions that her last pap smear was conducted at the age of 35 and was not repeated during her pregnancy as she was within the recommended testing interval. She plans to schedule a GYN visit in the near future to complete this.    IMMUNIZATIONS:   Flu: Flu vaccine completed elsewhere this season Prevnar 13: Prevnar 13 N/A for this patient Prevnar 20: Prevnar 20 N/A for this patient Pneumovax 23: Pneumovax 23 N/A for this patient Vac Shingrix: Shingrix N/A for this patient HPV: HPV N/A for this patient Tetanus: Tetanus completed in the last 10 years  HEALTH MAINTENANCE: Pap Smear HM Status: is due and to be scheduled by patient for later completion Mammogram HM Status: is not applicable for this patient Colon Cancer Screening HM Status: is not applicable for this patient Bone Density HM Status: is not applicable for this patient STI Testing HM Status: was declined   She reports regular vision exams q1-5y: Yes  She reports regular dental exams q 87m  Yes  The  patient eats a regular, healthy diet. She endorses exercise and/or activity of:  routine  She currently: Marital Status: married Living situation: with family Sexual: monogamous Occupation: RTherapist, sports Pertinent items are noted in HPI.  Most Recent Depression Screen:     05/06/2022    8:24 AM 10/16/2020    1:45 PM 02/13/2020    4:01 PM 08/01/2019    3:13 PM 07/19/2019    9:27 AM  Depression screen PHQ 2/9  Decreased Interest 0 1 0 2 3  Down, Depressed, Hopeless 0 0 0 2 3  PHQ - 2 Score 0 1 0 4 6  Altered sleeping  0 0 1 2  Tired, decreased energy  0 0 1 3  Change in appetite  0 0 3 3  Feeling bad or failure about yourself   0 0 1 3  Trouble concentrating  0 0 2 3  Moving slowly or fidgety/restless  0 0 0 1  Suicidal thoughts  0 0 0 0  PHQ-9 Score  1 0 12 21  Difficult doing work/chores  Not difficult at all Not difficult at all     Most Recent Anxiety Screen:     08/01/2019    3:14 PM 07/19/2019    9:29 AM 07/09/2019    1:13 PM 07/04/2019    2:51 PM  GAD 7 : Generalized Anxiety Score  Nervous, Anxious, on Edge 1 3 0 0  Control/stop worrying 1 3 0 0  Worry too much - different things 1 3  0 0  Trouble relaxing 2 3 0 0  Restless 1 3 0 0  Easily annoyed or irritable 1 1 0 0  Afraid - awful might happen 1 2 0 0  Total GAD 7 Score 8 18 0 0  Anxiety Difficulty Not difficult at all      Most Recent Fall Screen:    05/06/2022    8:24 AM 10/16/2020    1:44 PM 05/09/2018   11:20 AM  Fall Risk   Falls in the past year? 0 0 0  Number falls in past yr: 0 0   Injury with Fall? 0 0   Risk for fall due to : No Fall Risks No Fall Risks   Follow up Falls evaluation completed Falls evaluation completed     Past medical history, surgical history, medications, allergies, family history and social history reviewed with patient today and changes made to appropriate areas of the chart.  Past Medical History:  Past Medical History:  Diagnosis Date   Contusion of left knee 05/22/2017   Depression     Medications:  No current outpatient medications on file prior to visit.   No current facility-administered medications on file prior to visit.   Surgical History:  Past Surgical History:  Procedure Laterality Date   WISDOM TOOTH EXTRACTION  2012   Allergies:  No Known Allergies Family History:  Family History  Problem Relation Age of Onset   Cancer Mother        melanoma   Hypertension Mother    Hyperlipidemia Father    Cancer Maternal Grandmother        breast cancer   Cancer Maternal Grandfather        skin cancer       Objective:    BP 112/74   Pulse 91   Ht '5\' 6"'$  (1.676 m)   Wt 144 lb 6.4 oz (65.5 kg)   BMI 23.31 kg/m   Wt Readings from Last 3 Encounters:  05/06/22 144 lb 6.4 oz (65.5 kg)  10/16/20 154 lb 6.4 oz (70 kg)  04/10/20 152 lb 12.8 oz (69.3 kg)    Physical Exam Vitals and nursing note reviewed.  Constitutional:      General: She is not in acute distress.    Appearance: Normal appearance.  HENT:     Head: Normocephalic and atraumatic.     Right Ear: Hearing, tympanic membrane, ear canal and external ear normal.     Left Ear: Hearing, tympanic membrane, ear canal and external ear normal.     Nose: Nose normal.     Right Sinus: No maxillary sinus tenderness or frontal sinus tenderness.     Left Sinus: No maxillary sinus tenderness or frontal sinus tenderness.     Mouth/Throat:     Lips: Pink.     Mouth: Mucous membranes are moist.     Pharynx: Oropharynx is clear.  Eyes:     General: Lids are normal. Vision grossly intact.     Extraocular Movements: Extraocular movements intact.     Conjunctiva/sclera: Conjunctivae normal.     Pupils: Pupils are equal, round, and reactive to light.     Funduscopic exam:    Right eye: Red reflex present.        Left eye: Red reflex present.    Visual Fields: Right eye visual fields normal and left eye visual fields normal.  Neck:     Thyroid: No thyromegaly.     Vascular: No carotid bruit.   Cardiovascular:  Rate and Rhythm: Normal rate and regular rhythm.     Chest Wall: PMI is not displaced.     Pulses: Normal pulses.          Dorsalis pedis pulses are 2+ on the right side and 2+ on the left side.       Posterior tibial pulses are 2+ on the right side and 2+ on the left side.     Heart sounds: Normal heart sounds. No murmur heard. Pulmonary:     Effort: Pulmonary effort is normal. No respiratory distress.     Breath sounds: Wheezing present.  Abdominal:     General: Abdomen is flat. Bowel sounds are normal. There is no distension.     Palpations: Abdomen is soft. There is no hepatomegaly, splenomegaly or mass.     Tenderness: There is no abdominal tenderness. There is no right CVA tenderness, left CVA tenderness, guarding or rebound.  Musculoskeletal:        General: Normal range of motion.     Cervical back: Full passive range of motion without pain, normal range of motion and neck supple. No tenderness.     Right lower leg: No edema.     Left lower leg: No edema.  Feet:     Left foot:     Toenail Condition: Left toenails are normal.  Lymphadenopathy:     Cervical: Cervical adenopathy present.     Upper Body:     Right upper body: No supraclavicular adenopathy.     Left upper body: No supraclavicular adenopathy.  Skin:    General: Skin is warm and dry.     Capillary Refill: Capillary refill takes less than 2 seconds.     Nails: There is no clubbing.  Neurological:     General: No focal deficit present.     Mental Status: She is alert and oriented to person, place, and time.     GCS: GCS eye subscore is 4. GCS verbal subscore is 5. GCS motor subscore is 6.     Sensory: Sensation is intact.     Motor: Motor function is intact.     Coordination: Coordination is intact.     Gait: Gait is intact.     Deep Tendon Reflexes: Reflexes are normal and symmetric.  Psychiatric:        Attention and Perception: Attention normal.        Mood and Affect: Mood normal.         Speech: Speech normal.        Behavior: Behavior normal. Behavior is cooperative.        Thought Content: Thought content normal.        Cognition and Memory: Cognition and memory normal.        Judgment: Judgment normal.     Results for orders placed or performed in visit on 10/16/20  Comprehensive metabolic panel  Result Value Ref Range   Glucose 84 65 - 99 mg/dL   BUN 11 6 - 20 mg/dL   Creatinine, Ser 0.80 0.57 - 1.00 mg/dL   eGFR 105 >59 mL/min/1.73   BUN/Creatinine Ratio 14 9 - 23   Sodium 138 134 - 144 mmol/L   Potassium 4.0 3.5 - 5.2 mmol/L   Chloride 101 96 - 106 mmol/L   CO2 23 20 - 29 mmol/L   Calcium 9.3 8.7 - 10.2 mg/dL   Total Protein 7.6 6.0 - 8.5 g/dL   Albumin 4.7 3.9 - 5.0 g/dL   Globulin, Total  2.9 1.5 - 4.5 g/dL   Albumin/Globulin Ratio 1.6 1.2 - 2.2   Bilirubin Total <0.2 0.0 - 1.2 mg/dL   Alkaline Phosphatase 78 44 - 121 IU/L   AST 15 0 - 40 IU/L   ALT 12 0 - 32 IU/L  CBC with Differential/Platelet  Result Value Ref Range   WBC 8.7 3.4 - 10.8 x10E3/uL   RBC 4.57 3.77 - 5.28 x10E6/uL   Hemoglobin 12.5 11.1 - 15.9 g/dL   Hematocrit 38.6 34.0 - 46.6 %   MCV 85 79 - 97 fL   MCH 27.4 26.6 - 33.0 pg   MCHC 32.4 31.5 - 35.7 g/dL   RDW 13.2 11.7 - 15.4 %   Platelets 313 150 - 450 x10E3/uL   Neutrophils 63 Not Estab. %   Lymphs 27 Not Estab. %   Monocytes 8 Not Estab. %   Eos 1 Not Estab. %   Basos 1 Not Estab. %   Neutrophils Absolute 5.5 1.4 - 7.0 x10E3/uL   Lymphocytes Absolute 2.4 0.7 - 3.1 x10E3/uL   Monocytes Absolute 0.7 0.1 - 0.9 x10E3/uL   EOS (ABSOLUTE) 0.1 0.0 - 0.4 x10E3/uL   Basophils Absolute 0.1 0.0 - 0.2 x10E3/uL   Immature Granulocytes 0 Not Estab. %   Immature Grans (Abs) 0.0 0.0 - 0.1 x10E3/uL  TSH  Result Value Ref Range   TSH 2.480 0.450 - 4.500 uIU/mL         Assessment & Plan:   Problem List Items Addressed This Visit     Attention deficit hyperactivity disorder (ADHD)    Chronic ADHD well controlled with daily  Vyvanse. She has been able to get her medication from the pharmacy, but does express concerns with the timeliness of refills at times. She is not having any new or worsening symptoms. No concerns present with inconsistent use. Plan:   - Ensure patient has an adequate supply of Vyvanse. Will send in 6 month supply for her today. Given that she is so well controlled we will move follow-ups to every 6 months.    - Monitor patient's response or changes to medication.   - Address any prescription access issues.       Relevant Medications   lisdexamfetamine (VYVANSE) 40 MG capsule (Start on 07/29/2022)   lisdexamfetamine (VYVANSE) 40 MG capsule (Start on 06/03/2022)   lisdexamfetamine (VYVANSE) 40 MG capsule (Start on 07/01/2022)   lisdexamfetamine (VYVANSE) 40 MG capsule (Start on 08/26/2022)   lisdexamfetamine (VYVANSE) 40 MG capsule (Start on 09/23/2022)   Other Relevant Orders   CBC with Differential/Platelet   Comprehensive metabolic panel   Lipid panel   Hemoglobin A1c   VITAMIN D 25 Hydroxy (Vit-D Deficiency, Fractures)   TSH   B12 and Folate Panel   Iron, TIBC and Ferritin Panel   Encounter for annual physical exam - Primary    CPE today with no abnormalities noted on exam.  Labs pending. Will make changes as necessary based on results.  Review of HM activities and recommendations discussed and provided on AVS Anticipatory guidance, diet, and exercise recommendations provided.  Medications, allergies, and hx reviewed and updated as necessary.  Plan to f/u with CPE in 1 year or sooner for acute/chronic health needs as directed.      Relevant Orders   CBC with Differential/Platelet   Comprehensive metabolic panel   Lipid panel   Hemoglobin A1c   VITAMIN D 25 Hydroxy (Vit-D Deficiency, Fractures)   TSH   B12 and Folate Panel  Iron, TIBC and Ferritin Panel   Moderate episode of recurrent major depressive disorder (HCC)     Well controlled at this time on bupropion and buspar daily.  No alarm symptoms are present. Plan:   - Continue Bupropion at 300 mg daily and buspar '10mg'$  TID.   - Monitor mood and anxiety levels.   - Adjust treatment as needed.      Relevant Medications   busPIRone (BUSPAR) 10 MG tablet   buPROPion (WELLBUTRIN XL) 150 MG 24 hr tablet   Postpartum anxiety     Well controlled at this time on bupropion and buspar daily. No alarm symptoms are present. Plan:   - Continue Bupropion at 300 mg daily and buspar '10mg'$  TID.   - Monitor mood and anxiety levels.   - Adjust treatment as needed.      Relevant Medications   busPIRone (BUSPAR) 10 MG tablet   buPROPion (WELLBUTRIN XL) 150 MG 24 hr tablet   Fatigue    Fatigue of unknown etiology present for the past several months. She expresses concerns with low vitamin d or iron levels today.  Plan:   - Order lab tests to assess vitamin D and ferritin levels.   - Consider vitamin D supplementation based on lab results.      Nausea   Relevant Medications   ondansetron (ZOFRAN-ODT) 4 MG disintegrating tablet   Acne vulgaris    Well controlled at this time on spironolactone and continuous oral contraceptives. No concerns present.  Plan:   - Continue current birth control regimen and spironolactone   - Monitor for breakthrough bleeding or side effects.   - will monitor labs today for K       Relevant Medications   spironolactone (ALDACTONE) 50 MG tablet   drospirenone-ethinyl estradiol (JASMIEL) 3-0.02 MG tablet   Other Visit Diagnoses     Routine adult health maintenance       Relevant Orders   CBC with Differential/Platelet   Comprehensive metabolic panel   Lipid panel   Hemoglobin A1c   VITAMIN D 25 Hydroxy (Vit-D Deficiency, Fractures)   TSH   B12 and Folate Panel   Iron, TIBC and Ferritin Panel          Follow up plan: Return in about 6 months (around 11/04/2022) for ADHD F/U- virtual OK.  NEXT PREVENTATIVE PHYSICAL DUE IN 1 YEAR.  PATIENT COUNSELING PROVIDED FOR ALL ADULT  PATIENTS:  Consume a well balanced diet low in saturated fats, cholesterol, and moderation in carbohydrates.   This can be as simple as monitoring portion sizes and cutting back on sugary beverages such as soda and juice to start with.    Daily water consumption of at least 64 ounces.  Physical activity at least 180 minutes per week, if just starting out.   This can be as simple as taking the stairs instead of the elevator and walking 2-3 laps around the office  purposefully every day.   STD protection, partner selection, and regular testing if high risk.  Limited consumption of alcoholic beverages if alcohol is consumed.  For women, I recommend no more than 7 alcoholic beverages per week, spread out throughout the week.  Avoid "binge" drinking or consuming large quantities of alcohol in one setting.   Please let me know if you feel you may need help with reduction or quitting alcohol consumption.   Avoidance of nicotine, if used.  Please let me know if you feel you may need help with reduction or quitting  nicotine use.   Daily mental health attention.  This can be in the form of 5 minute daily meditation, prayer, journaling, yoga, reflection, etc.   Purposeful attention to your emotions and mental state can significantly improve your overall wellbeing and Health.  Please know that I am here to help you with all of your health care goals and am happy to work with you to find a solution that works best for you.  The greatest advice I have received with any changes in life are to take it one step at a time, that even means if all you can focus on is the next 60 seconds, then do that and celebrate your victories.  With any changes in life, you will have set backs, and that is OK. The important thing to remember is, if you have a set back, it is not a failure, it is an opportunity to try again!  Health Maintenance Recommendations Screening Testing Mammogram Every 1 -2 years based on  history and risk factors Starting at age 63 Pap Smear Ages 21-39 every 3 years Ages 54-65 every 5 years with HPV testing More frequent testing may be required based on results and history Colon Cancer Screening Every 1-10 years based on test performed, risk factors, and history Starting at age 73 Bone Density Screening Every 2-10 years based on history Starting at age 13 for women Recommendations for men differ based on medication usage, history, and risk factors AAA Screening One time ultrasound Men 75-25 years old who have every smoked Lung Cancer Screening Low Dose Lung CT every 12 months Age 57-80 years with a 30 pack-year smoking history who still smoke or who have quit within the last 15 years  Screening Labs Routine  Labs: Complete Blood Count (CBC), Complete Metabolic Panel (CMP), Cholesterol (Lipid Panel) Every 6-12 months based on history and medications May be recommended more frequently based on current conditions or previous results Hemoglobin A1c Lab Every 3-12 months based on history and previous results Starting at age 71 or earlier with diagnosis of diabetes, high cholesterol, BMI >26, and/or risk factors Frequent monitoring for patients with diabetes to ensure blood sugar control Thyroid Panel (TSH w/ T3 & T4) Every 6 months based on history, symptoms, and risk factors May be repeated more often if on medication HIV One time testing for all patients 76 and older May be repeated more frequently for patients with increased risk factors or exposure Hepatitis C One time testing for all patients 1 and older May be repeated more frequently for patients with increased risk factors or exposure Gonorrhea, Chlamydia Every 12 months for all sexually active persons 13-24 years Additional monitoring may be recommended for those who are considered high risk or who have symptoms PSA Men 69-65 years old with risk factors Additional screening may be recommended from age  68-69 based on risk factors, symptoms, and history  Vaccine Recommendations Tetanus Booster All adults every 10 years Flu Vaccine All patients 6 months and older every year COVID Vaccine All patients 12 years and older Initial dosing with booster May recommend additional booster based on age and health history HPV Vaccine 2 doses all patients age 49-26 Dosing may be considered for patients over 26 Shingles Vaccine (Shingrix) 2 doses all adults 60 years and older Pneumonia (Pneumovax 23) All adults 110 years and older May recommend earlier dosing based on health history Pneumonia (Prevnar 53) All adults 65 years and older Dosed 1 year after Pneumovax 23  Additional Screening, Testing,  and Vaccinations may be recommended on an individualized basis based on family history, health history, risk factors, and/or exposure.

## 2022-05-06 NOTE — Assessment & Plan Note (Signed)
Fatigue of unknown etiology present for the past several months. She expresses concerns with low vitamin d or iron levels today.  Plan:   - Order lab tests to assess vitamin D and ferritin levels.   - Consider vitamin D supplementation based on lab results.

## 2022-05-06 NOTE — Assessment & Plan Note (Signed)

## 2022-05-06 NOTE — Patient Instructions (Signed)
It was great to see you today.

## 2022-05-07 ENCOUNTER — Other Ambulatory Visit (HOSPITAL_COMMUNITY): Payer: Self-pay

## 2022-05-07 LAB — CBC WITH DIFFERENTIAL/PLATELET
Basophils Absolute: 0.1 10*3/uL (ref 0.0–0.2)
Basos: 1 %
EOS (ABSOLUTE): 0.1 10*3/uL (ref 0.0–0.4)
Eos: 2 %
Hematocrit: 36.6 % (ref 34.0–46.6)
Hemoglobin: 12.2 g/dL (ref 11.1–15.9)
Immature Grans (Abs): 0 10*3/uL (ref 0.0–0.1)
Immature Granulocytes: 0 %
Lymphocytes Absolute: 2.7 10*3/uL (ref 0.7–3.1)
Lymphs: 38 %
MCH: 27.7 pg (ref 26.6–33.0)
MCHC: 33.3 g/dL (ref 31.5–35.7)
MCV: 83 fL (ref 79–97)
Monocytes Absolute: 0.6 10*3/uL (ref 0.1–0.9)
Monocytes: 8 %
Neutrophils Absolute: 3.6 10*3/uL (ref 1.4–7.0)
Neutrophils: 51 %
Platelets: 320 10*3/uL (ref 150–450)
RBC: 4.41 x10E6/uL (ref 3.77–5.28)
RDW: 12.5 % (ref 11.7–15.4)
WBC: 7.1 10*3/uL (ref 3.4–10.8)

## 2022-05-07 LAB — COMPREHENSIVE METABOLIC PANEL
ALT: 14 IU/L (ref 0–32)
AST: 17 IU/L (ref 0–40)
Albumin/Globulin Ratio: 1.5 (ref 1.2–2.2)
Albumin: 4.3 g/dL (ref 4.0–5.0)
Alkaline Phosphatase: 75 IU/L (ref 44–121)
BUN/Creatinine Ratio: 15 (ref 9–23)
BUN: 12 mg/dL (ref 6–20)
Bilirubin Total: 0.3 mg/dL (ref 0.0–1.2)
CO2: 20 mmol/L (ref 20–29)
Calcium: 9.6 mg/dL (ref 8.7–10.2)
Chloride: 102 mmol/L (ref 96–106)
Creatinine, Ser: 0.8 mg/dL (ref 0.57–1.00)
Globulin, Total: 2.8 g/dL (ref 1.5–4.5)
Glucose: 89 mg/dL (ref 70–99)
Potassium: 4.7 mmol/L (ref 3.5–5.2)
Sodium: 144 mmol/L (ref 134–144)
Total Protein: 7.1 g/dL (ref 6.0–8.5)
eGFR: 103 mL/min/{1.73_m2} (ref >59)

## 2022-05-07 LAB — IRON,TIBC AND FERRITIN PANEL
Ferritin: 20 ng/mL (ref 15–150)
Iron Saturation: 17 % (ref 15–55)
Iron: 84 ug/dL (ref 27–159)
Total Iron Binding Capacity: 496 ug/dL — ABNORMAL HIGH (ref 250–450)
UIBC: 412 ug/dL (ref 131–425)

## 2022-05-07 LAB — TSH: TSH: 2.6 u[IU]/mL (ref 0.450–4.500)

## 2022-05-07 LAB — LIPID PANEL
Chol/HDL Ratio: 2.8 ratio (ref 0.0–4.4)
Cholesterol, Total: 192 mg/dL (ref 100–199)
HDL: 69 mg/dL (ref >39)
LDL Chol Calc (NIH): 108 mg/dL — ABNORMAL HIGH (ref 0–99)
Triglycerides: 83 mg/dL (ref 0–149)
VLDL Cholesterol Cal: 15 mg/dL (ref 5–40)

## 2022-05-07 LAB — B12 AND FOLATE PANEL
Folate: 15.6 ng/mL (ref >3.0)
Vitamin B-12: 437 pg/mL (ref 232–1245)

## 2022-05-07 LAB — HEMOGLOBIN A1C
Est. average glucose Bld gHb Est-mCnc: 103 mg/dL
Hgb A1c MFr Bld: 5.2 % (ref 4.8–5.6)

## 2022-05-07 LAB — VITAMIN D 25 HYDROXY (VIT D DEFICIENCY, FRACTURES): Vit D, 25-Hydroxy: 40.4 ng/mL (ref 30.0–100.0)

## 2022-05-10 ENCOUNTER — Encounter: Payer: Self-pay | Admitting: Nurse Practitioner

## 2022-05-10 ENCOUNTER — Other Ambulatory Visit (HOSPITAL_COMMUNITY): Payer: Self-pay

## 2022-05-10 DIAGNOSIS — F900 Attention-deficit hyperactivity disorder, predominantly inattentive type: Secondary | ICD-10-CM

## 2022-05-10 MED ORDER — LISDEXAMFETAMINE DIMESYLATE 50 MG PO CHEW
50.0000 mg | CHEWABLE_TABLET | Freq: Every day | ORAL | 0 refills | Status: DC
  Filled 2022-05-10: qty 30, 30d supply, fill #0

## 2022-05-11 ENCOUNTER — Other Ambulatory Visit (HOSPITAL_COMMUNITY): Payer: Self-pay

## 2022-06-01 ENCOUNTER — Other Ambulatory Visit (HOSPITAL_COMMUNITY): Payer: Self-pay

## 2022-06-01 ENCOUNTER — Other Ambulatory Visit: Payer: Self-pay | Admitting: Nurse Practitioner

## 2022-06-01 DIAGNOSIS — F900 Attention-deficit hyperactivity disorder, predominantly inattentive type: Secondary | ICD-10-CM

## 2022-06-01 MED ORDER — LISDEXAMFETAMINE DIMESYLATE 50 MG PO CHEW
50.0000 mg | CHEWABLE_TABLET | Freq: Every day | ORAL | 0 refills | Status: DC
  Filled 2022-06-01 – 2022-06-08 (×2): qty 30, 30d supply, fill #0

## 2022-06-01 NOTE — Telephone Encounter (Signed)
Refill request last apt 05/06/22 next apt 11/04/22.

## 2022-06-02 ENCOUNTER — Other Ambulatory Visit (HOSPITAL_COMMUNITY): Payer: Self-pay

## 2022-06-08 ENCOUNTER — Other Ambulatory Visit: Payer: Self-pay

## 2022-06-08 ENCOUNTER — Other Ambulatory Visit (HOSPITAL_COMMUNITY): Payer: Self-pay

## 2022-06-09 ENCOUNTER — Other Ambulatory Visit (HOSPITAL_COMMUNITY): Payer: Self-pay

## 2022-06-13 DIAGNOSIS — F801 Expressive language disorder: Secondary | ICD-10-CM | POA: Diagnosis not present

## 2022-06-20 DIAGNOSIS — F801 Expressive language disorder: Secondary | ICD-10-CM | POA: Diagnosis not present

## 2022-06-27 DIAGNOSIS — F801 Expressive language disorder: Secondary | ICD-10-CM | POA: Diagnosis not present

## 2022-07-04 DIAGNOSIS — F801 Expressive language disorder: Secondary | ICD-10-CM | POA: Diagnosis not present

## 2022-07-13 DIAGNOSIS — F801 Expressive language disorder: Secondary | ICD-10-CM | POA: Diagnosis not present

## 2022-07-18 DIAGNOSIS — F801 Expressive language disorder: Secondary | ICD-10-CM | POA: Diagnosis not present

## 2022-07-25 DIAGNOSIS — F801 Expressive language disorder: Secondary | ICD-10-CM | POA: Diagnosis not present

## 2022-08-01 DIAGNOSIS — F801 Expressive language disorder: Secondary | ICD-10-CM | POA: Diagnosis not present

## 2022-08-04 ENCOUNTER — Other Ambulatory Visit: Payer: Self-pay

## 2022-08-04 ENCOUNTER — Other Ambulatory Visit (HOSPITAL_COMMUNITY): Payer: Self-pay

## 2022-08-06 ENCOUNTER — Other Ambulatory Visit (HOSPITAL_COMMUNITY): Payer: Self-pay

## 2022-08-22 DIAGNOSIS — F801 Expressive language disorder: Secondary | ICD-10-CM | POA: Diagnosis not present

## 2022-08-29 DIAGNOSIS — F801 Expressive language disorder: Secondary | ICD-10-CM | POA: Diagnosis not present

## 2022-09-07 DIAGNOSIS — F801 Expressive language disorder: Secondary | ICD-10-CM | POA: Diagnosis not present

## 2022-09-12 DIAGNOSIS — F82 Specific developmental disorder of motor function: Secondary | ICD-10-CM | POA: Diagnosis not present

## 2022-09-12 DIAGNOSIS — F801 Expressive language disorder: Secondary | ICD-10-CM | POA: Diagnosis not present

## 2022-09-19 DIAGNOSIS — F82 Specific developmental disorder of motor function: Secondary | ICD-10-CM | POA: Diagnosis not present

## 2022-09-19 DIAGNOSIS — F801 Expressive language disorder: Secondary | ICD-10-CM | POA: Diagnosis not present

## 2022-09-26 DIAGNOSIS — F801 Expressive language disorder: Secondary | ICD-10-CM | POA: Diagnosis not present

## 2022-09-26 DIAGNOSIS — F82 Specific developmental disorder of motor function: Secondary | ICD-10-CM | POA: Diagnosis not present

## 2022-10-03 DIAGNOSIS — F801 Expressive language disorder: Secondary | ICD-10-CM | POA: Diagnosis not present

## 2022-10-03 DIAGNOSIS — F82 Specific developmental disorder of motor function: Secondary | ICD-10-CM | POA: Diagnosis not present

## 2022-10-04 ENCOUNTER — Other Ambulatory Visit: Payer: Self-pay

## 2022-10-04 ENCOUNTER — Other Ambulatory Visit (HOSPITAL_COMMUNITY): Payer: Self-pay

## 2022-10-06 DIAGNOSIS — H52223 Regular astigmatism, bilateral: Secondary | ICD-10-CM | POA: Diagnosis not present

## 2022-10-06 DIAGNOSIS — H5213 Myopia, bilateral: Secondary | ICD-10-CM | POA: Diagnosis not present

## 2022-10-10 DIAGNOSIS — F801 Expressive language disorder: Secondary | ICD-10-CM | POA: Diagnosis not present

## 2022-10-10 DIAGNOSIS — F82 Specific developmental disorder of motor function: Secondary | ICD-10-CM | POA: Diagnosis not present

## 2022-10-27 ENCOUNTER — Telehealth: Payer: 59 | Admitting: Physician Assistant

## 2022-10-27 DIAGNOSIS — R3989 Other symptoms and signs involving the genitourinary system: Secondary | ICD-10-CM | POA: Diagnosis not present

## 2022-10-28 ENCOUNTER — Other Ambulatory Visit (HOSPITAL_BASED_OUTPATIENT_CLINIC_OR_DEPARTMENT_OTHER): Payer: Self-pay

## 2022-10-28 MED ORDER — CEPHALEXIN 500 MG PO CAPS
500.0000 mg | ORAL_CAPSULE | Freq: Two times a day (BID) | ORAL | 0 refills | Status: AC
  Filled 2022-10-28: qty 14, 7d supply, fill #0

## 2022-10-28 NOTE — Progress Notes (Signed)
I have spent 5 minutes in review of e-visit questionnaire, review and updating patient chart, medical decision making and response to patient.   William Cody Martin, PA-C    

## 2022-10-28 NOTE — Progress Notes (Signed)

## 2022-10-31 DIAGNOSIS — F82 Specific developmental disorder of motor function: Secondary | ICD-10-CM | POA: Diagnosis not present

## 2022-11-04 ENCOUNTER — Telehealth: Payer: 59 | Admitting: Nurse Practitioner

## 2022-11-10 DIAGNOSIS — F82 Specific developmental disorder of motor function: Secondary | ICD-10-CM | POA: Diagnosis not present

## 2022-11-11 ENCOUNTER — Encounter: Payer: Self-pay | Admitting: Nurse Practitioner

## 2022-11-11 ENCOUNTER — Telehealth: Payer: 59 | Admitting: Nurse Practitioner

## 2022-11-11 ENCOUNTER — Other Ambulatory Visit (HOSPITAL_COMMUNITY): Payer: Self-pay

## 2022-11-11 VITALS — Wt 145.0 lb

## 2022-11-11 DIAGNOSIS — F909 Attention-deficit hyperactivity disorder, unspecified type: Secondary | ICD-10-CM | POA: Diagnosis not present

## 2022-11-11 DIAGNOSIS — F331 Major depressive disorder, recurrent, moderate: Secondary | ICD-10-CM

## 2022-11-11 DIAGNOSIS — F900 Attention-deficit hyperactivity disorder, predominantly inattentive type: Secondary | ICD-10-CM

## 2022-11-11 MED ORDER — LISDEXAMFETAMINE DIMESYLATE 40 MG PO CAPS
40.0000 mg | ORAL_CAPSULE | ORAL | 0 refills | Status: DC
Start: 2023-01-06 — End: 2023-07-19
  Filled 2023-03-23: qty 30, 30d supply, fill #0

## 2022-11-11 MED ORDER — LISDEXAMFETAMINE DIMESYLATE 40 MG PO CAPS
40.0000 mg | ORAL_CAPSULE | ORAL | 0 refills | Status: DC
Start: 2022-12-09 — End: 2023-07-19
  Filled 2023-01-25: qty 30, 30d supply, fill #0

## 2022-11-11 MED ORDER — LISDEXAMFETAMINE DIMESYLATE 40 MG PO CAPS
40.0000 mg | ORAL_CAPSULE | Freq: Every day | ORAL | 0 refills | Status: DC
Start: 2022-11-11 — End: 2023-07-19
  Filled 2022-11-11 – 2022-11-23 (×2): qty 30, 30d supply, fill #0

## 2022-11-11 MED ORDER — LISDEXAMFETAMINE DIMESYLATE 40 MG PO CAPS
40.0000 mg | ORAL_CAPSULE | Freq: Every day | ORAL | 0 refills | Status: DC
Start: 2023-02-03 — End: 2023-05-29
  Filled 2023-02-21 – 2023-05-05 (×2): qty 30, 30d supply, fill #0

## 2022-11-11 NOTE — Assessment & Plan Note (Signed)
Chronic. Currently doing well on vyvanse 40mg  capsule. No alarm symptoms present. PDMP reviewed today.  She has been stable on this medication and dose for a while, therefore, we will plan to follow-up in 6 months for repeat evaluation.

## 2022-11-11 NOTE — Progress Notes (Signed)
Virtual Visit Encounter mychart visit.   I connected with  Hannah Duncan on 11/11/22 at 12:00 PM EDT by secure video and audio telemedicine application. I verified that I am speaking with the correct person using two identifiers.   I introduced myself as a Publishing rights manager with the practice. The limitations of evaluation and management by telemedicine discussed with the patient and the availability of in person appointments. The patient expressed verbal understanding and consent to proceed.  Participating parties in this visit include: Myself and patient  The patient is: Patient Location: Home I am: Provider Location: Office/Clinic Subjective:    CC and HPI: Hannah Duncan is a 27 y.o. year old female presenting for follow up of ADHD.  Hannah Duncan tells me she has been doing very well on her vyvanse 40mg  capsule. We did have to change to the 50mg  chew at one point due to issues with supply of the other dose and she tells me this felt like a little too much. She denies symptoms of anxiety, palpitations, trouble sleeping, decreased appetite. The medication is working well for her.   Past medical history, Surgical history, Family history not pertinant except as noted below, Social history, Allergies, and medications have been entered into the medical record, reviewed, and corrections made.   Review of Systems:  All review of systems negative except what is listed in the HPI  Objective:    Alert and oriented x 4 Speaking in clear sentences with no shortness of breath. No distress.  Impression and Recommendations:    Problem List Items Addressed This Visit     Attention deficit hyperactivity disorder (ADHD)    Chronic. Currently doing well on vyvanse 40mg  capsule. No alarm symptoms present. PDMP reviewed today.  She has been stable on this medication and dose for a while, therefore, we will plan to follow-up in 6 months for repeat evaluation.       Relevant Medications   lisdexamfetamine  (VYVANSE) 40 MG capsule (Start on 02/03/2023)   lisdexamfetamine (VYVANSE) 40 MG capsule (Start on 01/06/2023)   lisdexamfetamine (VYVANSE) 40 MG capsule (Start on 12/09/2022)   lisdexamfetamine (VYVANSE) 40 MG capsule   Moderate episode of recurrent major depressive disorder (HCC) - Primary    Well controlled. No concerns.       current treatment plan is effective, no change in therapy I discussed the assessment and treatment plan with the patient. The patient was provided an opportunity to ask questions and all were answered. The patient agreed with the plan and demonstrated an understanding of the instructions.   The patient was advised to call back or seek an in-person evaluation if the symptoms worsen or if the condition fails to improve as anticipated.  Follow-Up: in 6 months  I provided 10 minutes of non-face-to-face interaction with this non face-to-face encounter including intake, same-day documentation, and chart review.   Tollie Eth, NP , DNP, AGNP-c [facility]

## 2022-11-11 NOTE — Assessment & Plan Note (Signed)
Well-controlled.  No concerns.

## 2022-11-15 ENCOUNTER — Other Ambulatory Visit (HOSPITAL_COMMUNITY): Payer: Self-pay

## 2022-11-17 ENCOUNTER — Encounter: Payer: 59 | Admitting: Family Medicine

## 2022-11-22 ENCOUNTER — Other Ambulatory Visit (HOSPITAL_COMMUNITY): Payer: Self-pay

## 2022-11-23 ENCOUNTER — Other Ambulatory Visit: Payer: Self-pay

## 2022-11-23 ENCOUNTER — Other Ambulatory Visit (HOSPITAL_COMMUNITY): Payer: Self-pay

## 2022-11-24 DIAGNOSIS — F82 Specific developmental disorder of motor function: Secondary | ICD-10-CM | POA: Diagnosis not present

## 2022-12-01 DIAGNOSIS — F82 Specific developmental disorder of motor function: Secondary | ICD-10-CM | POA: Diagnosis not present

## 2022-12-08 DIAGNOSIS — F82 Specific developmental disorder of motor function: Secondary | ICD-10-CM | POA: Diagnosis not present

## 2022-12-22 ENCOUNTER — Other Ambulatory Visit: Payer: Self-pay | Admitting: Medical Genetics

## 2022-12-22 DIAGNOSIS — Z006 Encounter for examination for normal comparison and control in clinical research program: Secondary | ICD-10-CM

## 2022-12-28 ENCOUNTER — Encounter: Payer: Self-pay | Admitting: Nurse Practitioner

## 2023-01-02 ENCOUNTER — Telehealth: Payer: Self-pay

## 2023-01-02 DIAGNOSIS — J00 Acute nasopharyngitis [common cold]: Secondary | ICD-10-CM | POA: Diagnosis not present

## 2023-01-02 NOTE — Telephone Encounter (Signed)
Left voicemail for patient to call back and confirm her upcoming appointment.

## 2023-01-04 ENCOUNTER — Other Ambulatory Visit (HOSPITAL_COMMUNITY): Payer: Self-pay

## 2023-01-04 ENCOUNTER — Encounter: Payer: Self-pay | Admitting: Family Medicine

## 2023-01-04 ENCOUNTER — Other Ambulatory Visit (HOSPITAL_COMMUNITY)
Admission: RE | Admit: 2023-01-04 | Discharge: 2023-01-04 | Disposition: A | Discharge status: Home / Self Care | Payer: 59 | Source: Ambulatory Visit | Attending: Family Medicine | Admitting: Family Medicine

## 2023-01-04 ENCOUNTER — Ambulatory Visit: Payer: 59 | Admitting: Family Medicine

## 2023-01-04 VITALS — BP 108/65 | HR 85 | Ht 66.0 in | Wt 150.0 lb

## 2023-01-04 DIAGNOSIS — Z01419 Encounter for gynecological examination (general) (routine) without abnormal findings: Secondary | ICD-10-CM

## 2023-01-04 DIAGNOSIS — Z1339 Encounter for screening examination for other mental health and behavioral disorders: Secondary | ICD-10-CM

## 2023-01-04 MED ORDER — DROSPIRENONE-ETHINYL ESTRADIOL 3-0.02 MG PO TABS
1.0000 | ORAL_TABLET | Freq: Every day | ORAL | 3 refills | Status: DC
Start: 2023-01-04 — End: 2023-12-20
  Filled 2023-01-04: qty 84, 84d supply, fill #0
  Filled 2023-02-21: qty 84, 72d supply, fill #0
  Filled 2023-05-05: qty 84, 72d supply, fill #1
  Filled 2023-07-19: qty 84, 72d supply, fill #2
  Filled 2023-09-14: qty 84, 72d supply, fill #3

## 2023-01-04 NOTE — Progress Notes (Signed)
ANNUAL EXAM Patient name: Hannah Duncan MRN [id number]  Date of birth: 10/18/1995 Chief Complaint:   Annual Exam  History of Present Illness:   Hannah Duncan is a 27 y.o.  G64P1001  female  being seen today for a routine annual exam.  Current complaints: none On COC and doing well. Skips nonhormone tablets occasionally. Husband is getting vasectomy in December, then plans to come off COCs.  No LMP recorded (lmp unknown). (Menstrual status: Oral contraceptives).   Upstream - 01/04/23 0947       Pregnancy Intention Screening   Does the patient want to become pregnant in the next year? No    Does the patient's partner want to become pregnant in the next year? No    Would the patient like to discuss contraceptive options today? No      Contraception Wrap Up   Current Method Oral Contraceptive    End Method Oral Contraceptive    Contraception Counseling Provided No    How was the end contraceptive method provided? N/A             Last pap 2018. Results were: NILM w/ HRHPV negative. H/O abnormal pap: no Last mammogram: n/a     01/04/2023    9:48 AM 05/06/2022    8:24 AM 10/16/2020    1:45 PM 02/13/2020    4:01 PM 08/01/2019    3:13 PM  Depression screen PHQ 2/9  Decreased Interest 0 0 1 0 2  Down, Depressed, Hopeless 0 0 0 0 2  PHQ - 2 Score 0 0 1 0 4  Altered sleeping 0  0 0 1  Tired, decreased energy 0  0 0 1  Change in appetite 0  0 0 3  Feeling bad or failure about yourself  0  0 0 1  Trouble concentrating 0  0 0 2  Moving slowly or fidgety/restless 0  0 0 0  Suicidal thoughts 0  0 0 0  PHQ-9 Score 0  1 0 12  Difficult doing work/chores   Not difficult at all Not difficult at all         01/04/2023    9:48 AM 08/01/2019    3:14 PM 07/19/2019    9:29 AM 07/09/2019    1:13 PM  GAD 7 : Generalized Anxiety Score  Nervous, Anxious, on Edge 0 1 3 0  Control/stop worrying 0 1 3 0  Worry too much - different things 0 1 3 0  Trouble relaxing 0 2 3 0  Restless 0 1 3 0   Easily annoyed or irritable 0 1 1 0  Afraid - awful might happen 0 1 2 0  Total GAD 7 Score 0 8 18 0  Anxiety Difficulty  Not difficult at all       Review of Systems:   Pertinent items are noted in HPI Denies any headaches, blurred vision, fatigue, shortness of breath, chest pain, abdominal pain, abnormal vaginal discharge/itching/odor/irritation, problems with periods, bowel movements, urination, or intercourse unless otherwise stated above. Pertinent History Reviewed:  Reviewed past medical,surgical, social and family history.  Reviewed problem list, medications and allergies. Physical Assessment:   Vitals:   01/04/23 0938  BP: 108/65  Pulse: 85  Weight: 150 lb (68 kg)  Height: 5\' 6"  (1.676 m)  Body mass index is 24.21 kg/m.        Physical Examination:   General appearance - well appearing, and in no distress  Mental status - alert, oriented to person,  place, and time  Psych:  She has a normal mood and affect  Skin - warm and dry, normal color, no suspicious lesions noted  Chest - effort normal, all lung fields clear to auscultation bilaterally  Heart - normal rate and regular rhythm  Neck:  midline trachea, no thyromegaly or nodules  Breasts - breasts appear normal, no suspicious masses, no skin or nipple changes or axillary nodes  Abdomen - soft, nontender, nondistended, no masses or organomegaly  Pelvic - VULVA: normal appearing vulva with no masses, tenderness or lesions  VAGINA: normal appearing vagina with normal color and discharge, no lesions  CERVIX: normal appearing cervix without discharge or lesions, no CMT  Thin prep pap is done with HR HPV cotesting  UTERUS: uterus is felt to be normal size, shape, consistency and nontender   ADNEXA: No adnexal masses or tenderness noted.  Extremities:  No swelling or varicosities noted  Chaperone present for exam  Assessment & Plan:  1. Well woman exam with routine gynecological exam - Cytology - PAP( ) -  drospirenone-ethinyl estradiol (JASMIEL) 3-0.02 MG tablet; Take 1 tablet by mouth daily. Skip non-hormone days and start a new pack to allow for continuous suppression.  Dispense: 84 tablet; Refill: 3   Labs/procedures today:  No orders of the defined types were placed in this encounter.   Meds:  Meds ordered this encounter  Medications   drospirenone-ethinyl estradiol (JASMIEL) 3-0.02 MG tablet    Sig: Take 1 tablet by mouth daily. Skip non-hormone days and start a new pack to allow for continuous suppression.    Dispense:  84 tablet    Refill:  3    Follow-up: No follow-ups on file.  Levie Heritage, DO 01/04/2023 10:26 AM

## 2023-01-09 LAB — CYTOLOGY - PAP
Adequacy: ABSENT
Diagnosis: NEGATIVE

## 2023-01-25 ENCOUNTER — Other Ambulatory Visit (HOSPITAL_COMMUNITY): Payer: Self-pay

## 2023-01-30 ENCOUNTER — Other Ambulatory Visit (HOSPITAL_COMMUNITY): Payer: Self-pay

## 2023-02-21 ENCOUNTER — Other Ambulatory Visit: Payer: Self-pay

## 2023-02-21 ENCOUNTER — Other Ambulatory Visit (HOSPITAL_COMMUNITY): Payer: Self-pay

## 2023-02-22 ENCOUNTER — Other Ambulatory Visit (HOSPITAL_COMMUNITY): Payer: Self-pay

## 2023-02-23 ENCOUNTER — Other Ambulatory Visit (HOSPITAL_COMMUNITY): Payer: Self-pay

## 2023-03-05 ENCOUNTER — Telehealth: Payer: 59 | Admitting: Physician Assistant

## 2023-03-05 DIAGNOSIS — B9689 Other specified bacterial agents as the cause of diseases classified elsewhere: Secondary | ICD-10-CM

## 2023-03-05 DIAGNOSIS — J019 Acute sinusitis, unspecified: Secondary | ICD-10-CM | POA: Diagnosis not present

## 2023-03-06 ENCOUNTER — Other Ambulatory Visit (HOSPITAL_BASED_OUTPATIENT_CLINIC_OR_DEPARTMENT_OTHER): Payer: Self-pay

## 2023-03-06 MED ORDER — AMOXICILLIN-POT CLAVULANATE 875-125 MG PO TABS
1.0000 | ORAL_TABLET | Freq: Two times a day (BID) | ORAL | 0 refills | Status: DC
Start: 2023-03-06 — End: 2023-03-13
  Filled 2023-03-06: qty 14, 7d supply, fill #0

## 2023-03-06 NOTE — Progress Notes (Signed)

## 2023-03-06 NOTE — Progress Notes (Signed)
I have spent 5 minutes in review of e-visit questionnaire, review and updating patient chart, medical decision making and response to patient.   Mia Milan Cody Jacklynn Dehaas, PA-C    

## 2023-03-13 ENCOUNTER — Telehealth (HOSPITAL_BASED_OUTPATIENT_CLINIC_OR_DEPARTMENT_OTHER): Payer: Self-pay | Admitting: Pulmonary Disease

## 2023-03-13 ENCOUNTER — Other Ambulatory Visit (HOSPITAL_BASED_OUTPATIENT_CLINIC_OR_DEPARTMENT_OTHER): Payer: Self-pay

## 2023-03-13 MED ORDER — AMOXICILLIN-POT CLAVULANATE 875-125 MG PO TABS
1.0000 | ORAL_TABLET | Freq: Two times a day (BID) | ORAL | 0 refills | Status: AC
  Filled 2023-03-13: qty 6, 3d supply, fill #0

## 2023-03-13 NOTE — Telephone Encounter (Signed)
[facility] Telephone Encounter.  Contacted off regarding sinus infection with nasal congestion, facial pain. Was seen on e-visit and prescribed Augmentin but only given 7 days. Improved but persistent nasal congestion. Usually does well with 10 day course.  Will prescribe Augmentin additional 3 days Advised to call back and schedule appointment if symptoms not resolved.

## 2023-03-23 ENCOUNTER — Other Ambulatory Visit (HOSPITAL_COMMUNITY): Payer: Self-pay

## 2023-03-27 ENCOUNTER — Other Ambulatory Visit (HOSPITAL_COMMUNITY): Payer: Self-pay

## 2023-03-30 ENCOUNTER — Encounter: Payer: Self-pay | Admitting: Nurse Practitioner

## 2023-05-05 ENCOUNTER — Other Ambulatory Visit (HOSPITAL_COMMUNITY): Payer: Self-pay

## 2023-05-05 ENCOUNTER — Other Ambulatory Visit: Payer: Self-pay

## 2023-05-29 ENCOUNTER — Encounter: Payer: Self-pay | Admitting: Nurse Practitioner

## 2023-05-29 ENCOUNTER — Other Ambulatory Visit: Payer: Self-pay | Admitting: Nurse Practitioner

## 2023-05-29 DIAGNOSIS — F900 Attention-deficit hyperactivity disorder, predominantly inattentive type: Secondary | ICD-10-CM

## 2023-05-29 NOTE — Telephone Encounter (Signed)
 Last apt 10/15/22.

## 2023-05-30 ENCOUNTER — Other Ambulatory Visit (HOSPITAL_COMMUNITY): Payer: Self-pay

## 2023-05-30 MED ORDER — LISDEXAMFETAMINE DIMESYLATE 40 MG PO CAPS
40.0000 mg | ORAL_CAPSULE | Freq: Every day | ORAL | 0 refills | Status: DC
  Filled 2023-05-30 – 2023-06-02 (×2): qty 30, 30d supply, fill #0

## 2023-05-31 ENCOUNTER — Other Ambulatory Visit: Payer: Self-pay

## 2023-05-31 DIAGNOSIS — R238 Other skin changes: Secondary | ICD-10-CM

## 2023-05-31 DIAGNOSIS — Z808 Family history of malignant neoplasm of other organs or systems: Secondary | ICD-10-CM

## 2023-06-02 ENCOUNTER — Other Ambulatory Visit (HOSPITAL_COMMUNITY): Payer: Self-pay

## 2023-06-02 ENCOUNTER — Other Ambulatory Visit: Payer: Self-pay

## 2023-07-05 DIAGNOSIS — Z7189 Other specified counseling: Secondary | ICD-10-CM | POA: Diagnosis not present

## 2023-07-05 DIAGNOSIS — D225 Melanocytic nevi of trunk: Secondary | ICD-10-CM | POA: Diagnosis not present

## 2023-07-05 DIAGNOSIS — L814 Other melanin hyperpigmentation: Secondary | ICD-10-CM | POA: Diagnosis not present

## 2023-07-19 ENCOUNTER — Other Ambulatory Visit: Payer: Self-pay | Admitting: Nurse Practitioner

## 2023-07-19 ENCOUNTER — Other Ambulatory Visit (HOSPITAL_COMMUNITY): Payer: Self-pay

## 2023-07-19 ENCOUNTER — Other Ambulatory Visit: Payer: Self-pay

## 2023-07-19 DIAGNOSIS — F900 Attention-deficit hyperactivity disorder, predominantly inattentive type: Secondary | ICD-10-CM

## 2023-07-19 MED ORDER — LISDEXAMFETAMINE DIMESYLATE 40 MG PO CAPS
40.0000 mg | ORAL_CAPSULE | ORAL | 0 refills | Status: DC
  Filled 2023-11-02: qty 30, 30d supply, fill #0

## 2023-07-19 MED ORDER — LISDEXAMFETAMINE DIMESYLATE 40 MG PO CAPS
40.0000 mg | ORAL_CAPSULE | ORAL | 0 refills | Status: DC
  Filled 2023-07-19: qty 30, 30d supply, fill #0

## 2023-07-19 MED ORDER — LISDEXAMFETAMINE DIMESYLATE 40 MG PO CAPS
40.0000 mg | ORAL_CAPSULE | Freq: Every day | ORAL | 0 refills | Status: DC
  Filled 2023-09-14: qty 30, 30d supply, fill #0

## 2023-07-19 NOTE — Telephone Encounter (Signed)
 Last apt 11/11/22.

## 2023-08-11 ENCOUNTER — Encounter

## 2023-09-14 ENCOUNTER — Other Ambulatory Visit: Payer: Self-pay

## 2023-09-14 ENCOUNTER — Other Ambulatory Visit: Payer: Self-pay | Admitting: Nurse Practitioner

## 2023-09-14 DIAGNOSIS — L700 Acne vulgaris: Secondary | ICD-10-CM

## 2023-09-14 DIAGNOSIS — F331 Major depressive disorder, recurrent, moderate: Secondary | ICD-10-CM

## 2023-09-14 MED ORDER — SPIRONOLACTONE 50 MG PO TABS
50.0000 mg | ORAL_TABLET | Freq: Every day | ORAL | 3 refills | Status: AC
  Filled 2023-09-14: qty 90, 90d supply, fill #0
  Filled 2023-12-20: qty 90, 90d supply, fill #1
  Filled 2024-03-25: qty 90, 90d supply, fill #2

## 2023-09-14 MED ORDER — BUPROPION HCL ER (XL) 150 MG PO TB24
300.0000 mg | ORAL_TABLET | Freq: Every day | ORAL | 3 refills | Status: DC
  Filled 2023-09-14: qty 180, 90d supply, fill #0
  Filled 2023-12-20: qty 180, 90d supply, fill #1

## 2023-09-16 ENCOUNTER — Other Ambulatory Visit (HOSPITAL_COMMUNITY): Payer: Self-pay

## 2023-09-18 ENCOUNTER — Other Ambulatory Visit (HOSPITAL_COMMUNITY): Payer: Self-pay

## 2023-09-18 ENCOUNTER — Other Ambulatory Visit: Payer: Self-pay

## 2023-10-20 ENCOUNTER — Encounter: Payer: Self-pay | Admitting: Nurse Practitioner

## 2023-11-03 ENCOUNTER — Other Ambulatory Visit (HOSPITAL_COMMUNITY): Payer: Self-pay

## 2023-11-04 ENCOUNTER — Other Ambulatory Visit (HOSPITAL_COMMUNITY): Payer: Self-pay

## 2023-11-16 ENCOUNTER — Other Ambulatory Visit (HOSPITAL_COMMUNITY)
Admission: RE | Admit: 2023-11-16 | Discharge: 2023-11-16 | Disposition: A | Discharge status: Home / Self Care | Payer: Self-pay | Source: Ambulatory Visit | Attending: Medical Genetics | Admitting: Medical Genetics

## 2023-11-16 DIAGNOSIS — Z006 Encounter for examination for normal comparison and control in clinical research program: Secondary | ICD-10-CM | POA: Insufficient documentation

## 2023-11-26 LAB — GENECONNECT MOLECULAR SCREEN: Genetic Analysis Overall Interpretation: NEGATIVE

## 2023-12-04 ENCOUNTER — Ambulatory Visit: Admitting: Nurse Practitioner

## 2023-12-20 ENCOUNTER — Other Ambulatory Visit: Payer: Self-pay | Admitting: Nurse Practitioner

## 2023-12-20 ENCOUNTER — Other Ambulatory Visit: Payer: Self-pay

## 2023-12-20 ENCOUNTER — Other Ambulatory Visit: Payer: Self-pay | Admitting: Family Medicine

## 2023-12-20 ENCOUNTER — Other Ambulatory Visit (HOSPITAL_COMMUNITY): Payer: Self-pay

## 2023-12-20 DIAGNOSIS — F900 Attention-deficit hyperactivity disorder, predominantly inattentive type: Secondary | ICD-10-CM

## 2023-12-20 DIAGNOSIS — Z01419 Encounter for gynecological examination (general) (routine) without abnormal findings: Secondary | ICD-10-CM

## 2023-12-20 MED ORDER — LISDEXAMFETAMINE DIMESYLATE 40 MG PO CAPS: 40.0000 mg | ORAL_CAPSULE | Freq: Every day | ORAL | 0 refills | Status: DC

## 2023-12-20 MED ORDER — LISDEXAMFETAMINE DIMESYLATE 40 MG PO CAPS: 40.0000 mg | ORAL_CAPSULE | ORAL | 0 refills | Status: DC

## 2023-12-20 MED ORDER — LISDEXAMFETAMINE DIMESYLATE 40 MG PO CAPS
40.0000 mg | ORAL_CAPSULE | ORAL | 0 refills | Status: DC
  Filled 2023-12-20: qty 30, 30d supply, fill #0

## 2023-12-20 NOTE — Telephone Encounter (Signed)
 Last apt 11/11/22 next apt 02/26/24

## 2023-12-27 ENCOUNTER — Ambulatory Visit: Admitting: Nurse Practitioner

## 2024-01-03 ENCOUNTER — Other Ambulatory Visit (HOSPITAL_COMMUNITY): Payer: Self-pay

## 2024-01-03 MED ORDER — DROSPIRENONE-ETHINYL ESTRADIOL 3-0.02 MG PO TABS
1.0000 | ORAL_TABLET | Freq: Every day | ORAL | 3 refills | Status: AC
  Filled 2024-01-03: qty 84, 72d supply, fill #0
  Filled 2024-03-25: qty 84, 72d supply, fill #1

## 2024-01-06 ENCOUNTER — Other Ambulatory Visit (HOSPITAL_COMMUNITY): Payer: Self-pay

## 2024-02-13 ENCOUNTER — Other Ambulatory Visit: Payer: Self-pay | Admitting: Medical

## 2024-02-13 ENCOUNTER — Other Ambulatory Visit: Payer: Self-pay | Admitting: Nurse Practitioner

## 2024-02-13 ENCOUNTER — Other Ambulatory Visit (HOSPITAL_COMMUNITY): Payer: Self-pay

## 2024-02-13 DIAGNOSIS — F900 Attention-deficit hyperactivity disorder, predominantly inattentive type: Secondary | ICD-10-CM

## 2024-02-13 MED ORDER — LISDEXAMFETAMINE DIMESYLATE 40 MG PO CAPS
40.0000 mg | ORAL_CAPSULE | ORAL | 0 refills | Status: DC
  Filled 2024-02-13: qty 30, 30d supply, fill #0

## 2024-02-13 NOTE — Telephone Encounter (Signed)
 Request needs to be denied pt. Already has a refill on file for tomorrow.

## 2024-02-14 ENCOUNTER — Other Ambulatory Visit (HOSPITAL_COMMUNITY): Payer: Self-pay

## 2024-03-01 NOTE — Progress Notes (Signed)
 "  Flu: 12/13/23 Covid: can get today  Catheline Doing, DNP, AGNP-c [facility] [address] [place], [place] [address] Main Office [phone] VISIT TYPE: CPE on 03/04/2024 Today's Vitals   03/04/24 1349  BP: 116/82  Pulse: 86  Weight: 160 lb 3.2 oz (72.7 kg)  Height: 5' 4.75 (1.645 m)   Body mass index is 26.86 kg/m.  Wt Readings from Last 3 Encounters:  03/04/24 160 lb 3.2 oz (72.7 kg)  01/04/23 150 lb (68 kg)  11/11/22 145 lb (65.8 kg)     Subjective:  Annual Exam (CPE fasting labs, )  History of Present Illness Hannah Duncan is a 28 year old female who presents for her annual exam.   She experiences anhedonia on wellbutrin  and is open to trying an SSRI, noting that both of her siblings are on SSRIs (Prozac ).   No chest pain, shortness of breath, or swelling in her feet. Her periods are normal, although she occasionally skips the placebo pills.  She reports that Vyvanse  helps with her ADHD and the dopamine component. She has no side effects from her current dosing.   She has a history of taking Accutane, which helped her skin significantly. She is considering starting topical tretinoin  for further skin care maintenance.  She has been taking iron supplements every other day after noticing dents in her nails.  Pertinent items are noted in HPI.     03/04/2024    1:53 PM 01/04/2023    9:48 AM 05/06/2022    8:24 AM 10/16/2020    1:45 PM 02/13/2020    4:01 PM  Depression screen PHQ 2/9  Decreased Interest 1 0 0 1 0  Down, Depressed, Hopeless 0 0 0 0 0  PHQ - 2 Score 1 0 0 1 0  Altered sleeping 0 0  0 0  Tired, decreased energy 1 0  0 0  Change in appetite 0 0  0 0  Feeling bad or failure about yourself  0 0  0 0  Trouble concentrating 0 0  0 0  Moving slowly or fidgety/restless 0 0  0 0  Suicidal thoughts 0 0  0 0  PHQ-9 Score 2 0   1  0   Difficult doing work/chores Not difficult at all   Not difficult at all Not difficult at all     Data  saved with a previous flowsheet row definition       01/04/2023    9:48 AM 08/01/2019    3:14 PM 07/19/2019    9:29 AM 07/09/2019    1:13 PM  GAD 7 : Generalized Anxiety Score  Nervous, Anxious, on Edge 0 1 3 0  Control/stop worrying 0 1 3 0  Worry too much - different things 0 1 3 0  Trouble relaxing 0 2 3 0  Restless 0 1 3 0  Easily annoyed or irritable 0 1 1 0  Afraid - awful might happen 0 1 2 0  Total GAD 7 Score 0 8 18 0  Anxiety Difficulty  Not difficult at all         03/04/2024    1:51 PM 05/06/2022    8:24 AM 10/16/2020    1:44 PM 05/09/2018   11:20 AM  Fall Risk   Falls in the past year? 0 0 0 0   Number falls in past yr: 0 0 0   Injury with Fall? 0 0  0    Risk for fall due to : No  Fall Risks No Fall Risks No Fall Risks   Follow up Falls evaluation completed Falls evaluation completed Falls evaluation completed       Data saved with a previous flowsheet row definition   Past medical history, surgical history, medications, allergies, family history and social history reviewed with patient today and changes made to appropriate areas of the chart.      Objective:    Physical Exam Vitals and nursing note reviewed.  Constitutional:      General: She is not in acute distress.    Appearance: Normal appearance.  HENT:     Head: Normocephalic and atraumatic.     Right Ear: Hearing, tympanic membrane, ear canal and external ear normal.     Left Ear: Hearing, tympanic membrane, ear canal and external ear normal.     Nose: Nose normal.     Right Sinus: No maxillary sinus tenderness or frontal sinus tenderness.     Left Sinus: No maxillary sinus tenderness or frontal sinus tenderness.     Mouth/Throat:     Lips: Pink.     Mouth: Mucous membranes are moist.     Pharynx: Oropharynx is clear.  Eyes:     General: Lids are normal. Vision grossly intact.     Extraocular Movements: Extraocular movements intact.     Conjunctiva/sclera: Conjunctivae normal.     Pupils:  Pupils are equal, round, and reactive to light.     Funduscopic exam:    Right eye: Red reflex present.        Left eye: Red reflex present.    Visual Fields: Right eye visual fields normal and left eye visual fields normal.  Neck:     Thyroid: No thyromegaly.     Vascular: No carotid bruit.  Cardiovascular:     Rate and Rhythm: Normal rate and regular rhythm.     Chest Wall: PMI is not displaced.     Pulses: Normal pulses.          Dorsalis pedis pulses are 2+ on the right side and 2+ on the left side.       Posterior tibial pulses are 2+ on the right side and 2+ on the left side.     Heart sounds: Normal heart sounds. No murmur heard. Pulmonary:     Effort: Pulmonary effort is normal. No respiratory distress.     Breath sounds: Normal breath sounds.  Abdominal:     General: Abdomen is flat. Bowel sounds are normal. There is no distension.     Palpations: Abdomen is soft. There is no hepatomegaly, splenomegaly or mass.     Tenderness: There is no abdominal tenderness. There is no right CVA tenderness, left CVA tenderness, guarding or rebound.  Musculoskeletal:        General: Normal range of motion.     Cervical back: Full passive range of motion without pain, normal range of motion and neck supple. No tenderness.     Right lower leg: No edema.     Left lower leg: No edema.  Feet:     Left foot:     Toenail Condition: Left toenails are normal.  Lymphadenopathy:     Cervical: No cervical adenopathy.     Upper Body:     Right upper body: No supraclavicular adenopathy.     Left upper body: No supraclavicular adenopathy.  Skin:    General: Skin is warm and dry.     Capillary Refill: Capillary refill takes less than 2 seconds.  Nails: There is no clubbing.  Neurological:     General: No focal deficit present.     Mental Status: She is alert and oriented to person, place, and time.     GCS: GCS eye subscore is 4. GCS verbal subscore is 5. GCS motor subscore is 6.     Sensory:  Sensation is intact.     Motor: Motor function is intact.     Coordination: Coordination is intact.     Gait: Gait is intact.     Deep Tendon Reflexes: Reflexes are normal and symmetric.  Psychiatric:        Attention and Perception: Attention normal.        Mood and Affect: Mood normal.        Speech: Speech normal.        Behavior: Behavior normal. Behavior is cooperative.        Thought Content: Thought content normal.        Cognition and Memory: Cognition and memory normal.        Judgment: Judgment normal.         Assessment & Plan:   Assessment & Plan Encounter for annual physical exam CPE completed today. Review of HM activities and recommendations discussed and provided on AVS. Anticipatory guidance, diet, and exercise recommendations provided. Medications, allergies, and hx reviewed and updated as necessary. Orders placed as listed below.  Plan: - Labs ordered. Will make changes as necessary based on results.  - I will review these results and send recommendations via MyChart or a telephone call.  - F/U with CPE in 1 year or sooner for acute/chronic health needs as directed.   Orders:   Vitamin B12   CBC with Differential/Platelet   Comprehensive metabolic panel with GFR   VITAMIN D  25 Hydroxy (Vit-D Deficiency, Fractures)   Lipid panel   Iron, TIBC and Ferritin Panel  Attention deficit hyperactivity disorder (ADHD), predominantly inattentive type ADHD symptoms are well-controlled with Vyvanse . Considering tapering off bupropion  as Vyvanse  is managing ADHD symptoms adequately. - Continue Vyvanse  for ADHD management. - Taper off bupropion  as planned with fluoxetine  initiation. Orders:   lisdexamfetamine  (VYVANSE ) 40 MG capsule; Take 1 capsule (40 mg total) by mouth every morning.   lisdexamfetamine  (VYVANSE ) 40 MG capsule; Take 1 capsule (40 mg total) by mouth every morning.   lisdexamfetamine  (VYVANSE ) 40 MG capsule; Take 1 capsule (40 mg total) by mouth  daily.  Moderate episode of recurrent major depressive disorder (HCC) Mood described as 'antedonia'. Open to trying an SSRI, specifically fluoxetine , due to family history of positive response to SSRIs. Considering tapering off bupropion  as Vyvanse  is managing ADHD symptoms adequately. - Started fluoxetine  for one week, then instructed to begin every other day dosing of bupropion . - If fluoxetine  dose is effective, continue; if not, will consider increasing to 20 mg daily. - Monitor response to medication and adjust as needed. Orders:   FLUoxetine  (PROZAC ) 10 MG capsule; Take 1 capsule (10 mg total) by mouth daily.  Need for COVID-19 vaccine  Orders:   Pfizer Comirnaty Covid-19 Vaccine 59yrs & older  Vitamin B12 deficiency  Orders:   Vitamin B12   CBC with Differential/Platelet  Vitamin D  deficiency  Orders:   Comprehensive metabolic panel with GFR   VITAMIN D  25 Hydroxy (Vit-D Deficiency, Fractures)  Anemia, unspecified type Reports dents in nails and has been taking iron supplements. Plans to check iron levels to assess current status. - Ordered labs to check iron levels. Orders:  Iron, TIBC and Ferritin Panel  Acne vulgaris Previously treated with Accutane with success. Interested in trying topical tretinoin  for maintenance. - Prescribed topical tretinoin , starting every few days and gradually increasing frequency. Orders:   tretinoin  (RETIN-A ) 0.025 % cream; Apply topically at bedtime. Use good moisturizer and sunscreen.  NEXT PREVENTATIVE PHYSICAL DUE IN 1 YEAR.    PATIENT COUNSELING PROVIDED FOR ALL ADULT PATIENTS: A well balanced diet low in saturated fats, cholesterol, and moderation in carbohydrates.  This can be as simple as monitoring portion sizes and cutting back on sugary beverages such as soda and juice to start with.    Daily water consumption of at least 64 ounces.  Physical activity at least 180 minutes per week.  If just starting out, start 10  minutes a day and work your way up.   This can be as simple as taking the stairs instead of the elevator and walking 2-3 laps around the office  purposefully every day.   STD protection, partner selection, and regular testing if high risk.  Limited consumption of alcoholic beverages if alcohol is consumed. For men, I recommend no more than 14 alcoholic beverages per week, spread out throughout the week (max 2 per day). Avoid binge drinking or consuming large quantities of alcohol in one setting.  Please let me know if you feel you may need help with reduction or quitting alcohol consumption.   Avoidance of nicotine, if used. Please let me know if you feel you may need help with reduction or quitting nicotine use.   Daily mental health attention. This can be in the form of 5 minute daily meditation, prayer, journaling, yoga, reflection, etc.  Purposeful attention to your emotions and mental state can significantly improve your overall wellbeing  and  Health.  Please know that I am here to help you with all of your health care goals and am happy to work with you to find a solution that works best for you.  The greatest advice I have received with any changes in life are to take it one step at a time, that even means if all you can focus on is the next 60 seconds, then do that and celebrate your victories.  With any changes in life, you will have set backs, and that is OK. The important thing to remember is, if you have a set back, it is not a failure, it is an opportunity to try again! Screening Testing Mammogram Every 1 -2 years based on history and risk factors Starting at age 21 Pap Smear Ages 21-39 every 3 years Ages 8-65 every 5 years with HPV testing More frequent testing may be required based on results and history Colon Cancer Screening Every 1-10 years based on test performed, risk factors, and history Starting at age 13 Bone Density Screening Every 2-10 years based on  history Starting at age 66 for women Recommendations for men differ based on medication usage, history, and risk factors AAA Screening One time ultrasound Men 65-76 years old who have every smoked Lung Cancer Screening Low Dose Lung CT every 12 months Age 61-80 years with a 30 pack-year smoking history who still smoke or who have quit within the last 15 years   Screening Labs Routine  Labs: Complete Blood Count (CBC), Complete Metabolic Panel (CMP), Cholesterol (Lipid Panel) Every 6-12 months based on history and medications May be recommended more frequently based on current conditions or previous results Hemoglobin A1c Lab Every 3-12 months based on history and  previous results Starting at age 56 or earlier with diagnosis of diabetes, high cholesterol, BMI >26, and/or risk factors Frequent monitoring for patients with diabetes to ensure blood sugar control Thyroid Panel (TSH) Every 6 months based on history, symptoms, and risk factors May be repeated more often if on medication HIV One time testing for all patients 88 and older May be repeated more frequently for patients with increased risk factors or exposure Hepatitis C One time testing for all patients 30 and older May be repeated more frequently for patients with increased risk factors or exposure Gonorrhea, Chlamydia Every 12 months for all sexually active persons 13-24 years Additional monitoring may be recommended for those who are considered high risk or who have symptoms Every 12 months for any woman on birth control, regardless of sexual activity PSA Men 43-59 years old with risk factors Additional screening may be recommended from age 34-69 based on risk factors, symptoms, and history  Vaccine Recommendations Tetanus Booster All adults every 10 years Flu Vaccine All patients 6 months and older every year COVID Vaccine All patients 12 years and older Initial dosing with booster May recommend additional booster  based on age and health history HPV Vaccine 2 doses all patients age 15-26 Dosing may be considered for patients over 26 Shingles Vaccine (Shingrix) 2 doses all adults 55 years and older Pneumonia (Pneumovax 48) All adults 65 years and older May recommend earlier dosing based on health history One year apart from Prevnar 1 Pneumonia (Prevnar 1) All adults 65 years and older Dosed 1 year after Pneumovax 23 Pneumonia (Prevnar 20) One time alternative to the two dosing of 13 and 23 For all adults with initial dose of 23, 20 is recommended 1 year later For all adults with initial dose of 13, 23 is still recommended as second option 1 year later       "

## 2024-03-04 ENCOUNTER — Other Ambulatory Visit (HOSPITAL_COMMUNITY): Payer: Self-pay

## 2024-03-04 ENCOUNTER — Encounter: Payer: Self-pay | Admitting: Nurse Practitioner

## 2024-03-04 ENCOUNTER — Ambulatory Visit (INDEPENDENT_AMBULATORY_CARE_PROVIDER_SITE_OTHER): Payer: Self-pay | Admitting: Nurse Practitioner

## 2024-03-04 VITALS — BP 116/82 | HR 86 | Ht 64.75 in | Wt 160.2 lb

## 2024-03-04 DIAGNOSIS — L700 Acne vulgaris: Secondary | ICD-10-CM

## 2024-03-04 DIAGNOSIS — Z23 Encounter for immunization: Secondary | ICD-10-CM | POA: Diagnosis not present

## 2024-03-04 DIAGNOSIS — E559 Vitamin D deficiency, unspecified: Secondary | ICD-10-CM | POA: Diagnosis not present

## 2024-03-04 DIAGNOSIS — F900 Attention-deficit hyperactivity disorder, predominantly inattentive type: Secondary | ICD-10-CM | POA: Diagnosis not present

## 2024-03-04 DIAGNOSIS — D649 Anemia, unspecified: Secondary | ICD-10-CM

## 2024-03-04 DIAGNOSIS — F331 Major depressive disorder, recurrent, moderate: Secondary | ICD-10-CM | POA: Diagnosis not present

## 2024-03-04 DIAGNOSIS — Z0000 Encounter for general adult medical examination without abnormal findings: Secondary | ICD-10-CM | POA: Diagnosis not present

## 2024-03-04 DIAGNOSIS — E538 Deficiency of other specified B group vitamins: Secondary | ICD-10-CM

## 2024-03-04 LAB — LIPID PANEL

## 2024-03-04 MED ORDER — LISDEXAMFETAMINE DIMESYLATE 40 MG PO CAPS: 40.0000 mg | ORAL_CAPSULE | ORAL | 0 refills | Status: AC

## 2024-03-04 MED ORDER — LISDEXAMFETAMINE DIMESYLATE 40 MG PO CAPS: 40.0000 mg | ORAL_CAPSULE | Freq: Every day | ORAL | 0 refills | Status: AC

## 2024-03-04 MED ORDER — FLUOXETINE HCL 10 MG PO CAPS
10.0000 mg | ORAL_CAPSULE | Freq: Every day | ORAL | 0 refills | Status: AC
  Filled 2024-03-04: qty 90, 90d supply, fill #0

## 2024-03-04 MED ORDER — TRETINOIN 0.025 % EX CREA
TOPICAL_CREAM | Freq: Every day | CUTANEOUS | 2 refills | Status: AC
  Filled 2024-03-04: qty 45, 30d supply, fill #0
  Filled 2024-03-04: qty 45, fill #0

## 2024-03-04 MED ORDER — LISDEXAMFETAMINE DIMESYLATE 40 MG PO CAPS
40.0000 mg | ORAL_CAPSULE | ORAL | 0 refills | Status: AC
  Filled 2024-03-04 – 2024-03-25 (×2): qty 30, 30d supply, fill #0

## 2024-03-04 NOTE — Assessment & Plan Note (Addendum)
 Mood described as 'antedonia'. Open to trying an SSRI, specifically fluoxetine , due to family history of positive response to SSRIs. Considering tapering off bupropion  as Vyvanse  is managing ADHD symptoms adequately. - Started fluoxetine  for one week, then instructed to begin every other day dosing of bupropion . - If fluoxetine  dose is effective, continue; if not, will consider increasing to 20 mg daily. - Monitor response to medication and adjust as needed. Orders:   FLUoxetine  (PROZAC ) 10 MG capsule; Take 1 capsule (10 mg total) by mouth daily.

## 2024-03-04 NOTE — Assessment & Plan Note (Addendum)
 CPE completed today. Review of HM activities and recommendations discussed and provided on AVS. Anticipatory guidance, diet, and exercise recommendations provided. Medications, allergies, and hx reviewed and updated as necessary. Orders placed as listed below.  Plan: - Labs ordered. Will make changes as necessary based on results.  - I will review these results and send recommendations via MyChart or a telephone call.  - F/U with CPE in 1 year or sooner for acute/chronic health needs as directed.   Orders:   Vitamin B12   CBC with Differential/Platelet   Comprehensive metabolic panel with GFR   VITAMIN D  25 Hydroxy (Vit-D Deficiency, Fractures)   Lipid panel   Iron, TIBC and Ferritin Panel

## 2024-03-04 NOTE — Assessment & Plan Note (Addendum)
 Previously treated with Accutane with success. Interested in trying topical tretinoin  for maintenance. - Prescribed topical tretinoin , starting every few days and gradually increasing frequency. Orders:   tretinoin  (RETIN-A ) 0.025 % cream; Apply topically at bedtime. Use good moisturizer and sunscreen.

## 2024-03-04 NOTE — Patient Instructions (Addendum)
 Start the fluoxetine  once a day (usually morning is best) for 1 week then start the bupropion  taper.   Go to every other day dosing of bupropion  for 2 weeks then stop.   If you do not feel the fluoxetine  dose is enough at this time, then you may increase to 2 capsules (20mg  daily) and let me know.     For all adult patients, I recommend A well balanced diet low in saturated fats, cholesterol, and moderation in carbohydrates.   This can be as simple as monitoring portion sizes and cutting back on sugary beverages such as soda and juice to start with.    Daily water consumption of at least 64 ounces.  Physical activity at least 180 minutes per week, if just starting out.   This can be as simple as taking the stairs instead of the elevator and walking 2-3 laps around the office  purposefully every day.   STD protection, partner selection, and regular testing if high risk.  Limited consumption of alcoholic beverages if alcohol is consumed.  For women, I recommend no more than 7 alcoholic beverages per week, spread out throughout the week.  Avoid binge drinking or consuming large quantities of alcohol in one setting.   Please let me know if you feel you may need help with reduction or quitting alcohol consumption.   Avoidance of nicotine, if used.  Please let me know if you feel you may need help with reduction or quitting nicotine use.   Daily mental health attention.  This can be in the form of 5 minute daily meditation, prayer, journaling, yoga, reflection, etc.   Purposeful attention to your emotions and mental state can significantly improve your overall wellbeing  and  Health.  Please know that I am here to help you with all of your health care goals and am happy to work with you to find a solution that works best for you.  The greatest advice I have received with any changes in life are to take it one step at a time, that even means if all you can focus on is the next 60  seconds, then do that and celebrate your victories.  With any changes in life, you will have set backs, and that is OK. The important thing to remember is, if you have a set back, it is not a failure, it is an opportunity to try again!  Health Maintenance Recommendations Screening Testing Mammogram Every 1 -2 years based on history and risk factors Starting at age 31 Pap Smear Ages 21-39 every 3 years Ages 69-65 every 5 years with HPV testing More frequent testing may be required based on results and history Colon Cancer Screening Every 1-10 years based on test performed, risk factors, and history Starting at age 15 Bone Density Screening Every 2-10 years based on history Starting at age 60 for women Recommendations for men differ based on medication usage, history, and risk factors AAA Screening One time ultrasound Men 48-25 years old who have every smoked Lung Cancer Screening Low Dose Lung CT every 12 months Age 44-80 years with a 30 pack-year smoking history who still smoke or who have quit within the last 15 years  Screening Labs Routine  Labs: Complete Blood Count (CBC), Complete Metabolic Panel (CMP), Cholesterol (Lipid Panel) Every 6-12 months based on history and medications May be recommended more frequently based on current conditions or previous results Hemoglobin A1c Lab Every 3-12 months based on history and previous results Starting  at age 75 or earlier with diagnosis of diabetes, high cholesterol, BMI >26, and/or risk factors Frequent monitoring for patients with diabetes to ensure blood sugar control Thyroid Panel (TSH w/ T3 & T4) Every 6 months based on history, symptoms, and risk factors May be repeated more often if on medication HIV One time testing for all patients 45 and older May be repeated more frequently for patients with increased risk factors or exposure Hepatitis C One time testing for all patients 58 and older May be repeated more frequently  for patients with increased risk factors or exposure Gonorrhea, Chlamydia Every 12 months for all sexually active persons 13-24 years Additional monitoring may be recommended for those who are considered high risk or who have symptoms PSA Men 35-7 years old with risk factors Additional screening may be recommended from age 1-69 based on risk factors, symptoms, and history  Vaccine Recommendations Tetanus Booster All adults every 10 years Flu Vaccine All patients 6 months and older every year COVID Vaccine All patients 12 years and older Initial dosing with booster May recommend additional booster based on age and health history HPV Vaccine 2 doses all patients age 65-26 Dosing may be considered for patients over 26 Shingles Vaccine (Shingrix) 2 doses all adults 55 years and older Pneumonia (Pneumovax 23) All adults 65 years and older May recommend earlier dosing based on health history Pneumonia (Prevnar 85) All adults 65 years and older Dosed 1 year after Pneumovax 23  Additional Screening, Testing, and Vaccinations may be recommended on an individualized basis based on family history, health history, risk factors, and/or exposure.

## 2024-03-04 NOTE — Assessment & Plan Note (Addendum)
 ADHD symptoms are well-controlled with Vyvanse . Considering tapering off bupropion  as Vyvanse  is managing ADHD symptoms adequately. - Continue Vyvanse  for ADHD management. - Taper off bupropion  as planned with fluoxetine  initiation. Orders:   lisdexamfetamine  (VYVANSE ) 40 MG capsule; Take 1 capsule (40 mg total) by mouth every morning.   lisdexamfetamine  (VYVANSE ) 40 MG capsule; Take 1 capsule (40 mg total) by mouth every morning.   lisdexamfetamine  (VYVANSE ) 40 MG capsule; Take 1 capsule (40 mg total) by mouth daily.

## 2024-03-05 LAB — LIPID PANEL
Cholesterol, Total: 230 mg/dL — AB (ref 100–199)
HDL: 82 mg/dL
LDL CALC COMMENT:: 2.8 ratio (ref 0.0–4.4)
LDL Chol Calc (NIH): 131 mg/dL — AB (ref 0–99)
Triglycerides: 101 mg/dL (ref 0–149)
VLDL Cholesterol Cal: 17 mg/dL (ref 5–40)

## 2024-03-05 LAB — CBC WITH DIFFERENTIAL/PLATELET
Basophils Absolute: 0.1 x10E3/uL (ref 0.0–0.2)
Basos: 1 %
EOS (ABSOLUTE): 0.1 x10E3/uL (ref 0.0–0.4)
Eos: 1 %
Hematocrit: 42 % (ref 34.0–46.6)
Hemoglobin: 13.4 g/dL (ref 11.1–15.9)
Immature Grans (Abs): 0 x10E3/uL (ref 0.0–0.1)
Immature Granulocytes: 0 %
Lymphocytes Absolute: 2.2 x10E3/uL (ref 0.7–3.1)
Lymphs: 28 %
MCH: 28.2 pg (ref 26.6–33.0)
MCHC: 31.9 g/dL (ref 31.5–35.7)
MCV: 88 fL (ref 79–97)
Monocytes Absolute: 0.6 x10E3/uL (ref 0.1–0.9)
Monocytes: 8 %
Neutrophils Absolute: 4.9 x10E3/uL (ref 1.4–7.0)
Neutrophils: 62 %
Platelets: 306 x10E3/uL (ref 150–450)
RBC: 4.76 x10E6/uL (ref 3.77–5.28)
RDW: 12.9 % (ref 11.7–15.4)
WBC: 7.8 x10E3/uL (ref 3.4–10.8)

## 2024-03-05 LAB — COMPREHENSIVE METABOLIC PANEL WITH GFR
ALT: 14 IU/L (ref 0–32)
AST: 16 IU/L (ref 0–40)
Albumin: 4.7 g/dL (ref 4.0–5.0)
Alkaline Phosphatase: 90 IU/L (ref 41–116)
BUN/Creatinine Ratio: 14 (ref 9–23)
BUN: 11 mg/dL (ref 6–20)
Bilirubin Total: 0.3 mg/dL (ref 0.0–1.2)
CO2: 22 mmol/L (ref 20–29)
Calcium: 9.8 mg/dL (ref 8.7–10.2)
Chloride: 100 mmol/L (ref 96–106)
Creatinine, Ser: 0.81 mg/dL (ref 0.57–1.00)
Globulin, Total: 2.9 g/dL (ref 1.5–4.5)
Glucose: 92 mg/dL (ref 70–99)
Potassium: 4.4 mmol/L (ref 3.5–5.2)
Sodium: 138 mmol/L (ref 134–144)
Total Protein: 7.6 g/dL (ref 6.0–8.5)
eGFR: 101 mL/min/1.73

## 2024-03-05 LAB — IRON,TIBC AND FERRITIN PANEL
Ferritin: 60 ng/mL (ref 15–150)
Iron Saturation: 20 % (ref 15–55)
Iron: 90 ug/dL (ref 27–159)
Total Iron Binding Capacity: 442 ug/dL (ref 250–450)
UIBC: 352 ug/dL (ref 131–425)

## 2024-03-05 LAB — VITAMIN B12: Vitamin B-12: 441 pg/mL (ref 232–1245)

## 2024-03-05 LAB — VITAMIN D 25 HYDROXY (VIT D DEFICIENCY, FRACTURES): Vit D, 25-Hydroxy: 46.7 ng/mL (ref 30.0–100.0)

## 2024-03-12 ENCOUNTER — Encounter: Payer: Self-pay | Admitting: Nurse Practitioner

## 2024-03-13 ENCOUNTER — Ambulatory Visit: Payer: Self-pay | Admitting: Nurse Practitioner

## 2024-03-25 ENCOUNTER — Other Ambulatory Visit (HOSPITAL_COMMUNITY): Payer: Self-pay

## 2025-03-12 ENCOUNTER — Encounter: Admitting: Nurse Practitioner
